# Patient Record
Sex: Female | Born: 1990 | Race: White | Hispanic: No | Marital: Married | Smoking: Never smoker
Health system: Southern US, Community
[De-identification: ages and names within clinical notes are randomized; demographics above are authoritative.]

## PROBLEM LIST (undated history)

## (undated) DIAGNOSIS — R87629 Unspecified abnormal cytological findings in specimens from vagina: Secondary | ICD-10-CM

## (undated) DIAGNOSIS — Z789 Other specified health status: Secondary | ICD-10-CM

## (undated) DIAGNOSIS — F419 Anxiety disorder, unspecified: Secondary | ICD-10-CM

## (undated) DIAGNOSIS — R000 Tachycardia, unspecified: Secondary | ICD-10-CM

## (undated) HISTORY — DX: Tachycardia, unspecified: R00.0

## (undated) HISTORY — PX: NO PAST SURGERIES: SHX2092

## (undated) HISTORY — PX: OTHER SURGICAL HISTORY: SHX169

## (undated) HISTORY — DX: Unspecified abnormal cytological findings in specimens from vagina: R87.629

## (undated) HISTORY — DX: Anxiety disorder, unspecified: F41.9

---
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
## 2013-05-26 ENCOUNTER — Emergency Department (HOSPITAL_BASED_OUTPATIENT_CLINIC_OR_DEPARTMENT_OTHER)
Admission: EM | Admit: 2013-05-26 | Discharge: 2013-05-27 | Disposition: A | Discharge status: Home / Self Care | Payer: BC Managed Care – PPO | Attending: Emergency Medicine | Admitting: Emergency Medicine

## 2013-05-26 ENCOUNTER — Encounter (HOSPITAL_BASED_OUTPATIENT_CLINIC_OR_DEPARTMENT_OTHER): Payer: Self-pay | Admitting: Emergency Medicine

## 2013-05-26 DIAGNOSIS — Z79899 Other long term (current) drug therapy: Secondary | ICD-10-CM | POA: Insufficient documentation

## 2013-05-26 DIAGNOSIS — N72 Inflammatory disease of cervix uteri: Secondary | ICD-10-CM

## 2013-05-26 DIAGNOSIS — Z3202 Encounter for pregnancy test, result negative: Secondary | ICD-10-CM | POA: Insufficient documentation

## 2013-05-26 LAB — URINALYSIS, ROUTINE W REFLEX MICROSCOPIC
Bilirubin Urine: NEGATIVE
Ketones, ur: NEGATIVE mg/dL
Nitrite: NEGATIVE
Protein, ur: NEGATIVE mg/dL
Urobilinogen, UA: 1 mg/dL (ref 0.0–1.0)
pH: 7.5 (ref 5.0–8.0)

## 2013-05-26 LAB — URINE MICROSCOPIC-ADD ON

## 2013-05-26 LAB — PREGNANCY, URINE: Preg Test, Ur: NEGATIVE

## 2013-05-26 NOTE — ED Notes (Signed)
Patient with vaginal burning, itching, and discharge for the past two weeks.

## 2013-05-27 LAB — WET PREP, GENITAL
Trich, Wet Prep: NONE SEEN
Yeast Wet Prep HPF POC: NONE SEEN

## 2013-05-27 MED ORDER — AZITHROMYCIN 250 MG PO TABS: ORAL_TABLET | ORAL | Status: DC

## 2013-05-27 MED ORDER — ONDANSETRON 4 MG PO TBDP
ORAL_TABLET | ORAL | Status: AC
  Filled 2013-05-27: qty 1

## 2013-05-27 MED ORDER — AZITHROMYCIN 250 MG PO TABS
ORAL_TABLET | ORAL | Status: AC
  Filled 2013-05-27: qty 1

## 2013-05-27 MED ORDER — ONDANSETRON 4 MG PO TBDP
4.0000 mg | ORAL_TABLET | Freq: Once | ORAL | Status: AC
  Administered 2013-05-27: 4 mg via ORAL

## 2013-05-27 MED ORDER — LIDOCAINE HCL (PF) 1 % IJ SOLN
INTRAMUSCULAR | Status: AC
  Administered 2013-05-27: 1 mL
  Filled 2013-05-27: qty 5

## 2013-05-27 MED ORDER — AZITHROMYCIN 250 MG PO TABS
1000.0000 mg | ORAL_TABLET | Freq: Once | ORAL | Status: AC
  Administered 2013-05-27: 1000 mg via ORAL
  Filled 2013-05-27: qty 4

## 2013-05-27 MED ORDER — CEFTRIAXONE SODIUM 250 MG IJ SOLR
250.0000 mg | Freq: Once | INTRAMUSCULAR | Status: AC
  Administered 2013-05-27: 250 mg via INTRAMUSCULAR
  Filled 2013-05-27: qty 250

## 2013-05-27 NOTE — ED Provider Notes (Signed)
CSN: [id number]     Arrival date & time 05/26/13  2210 History   First MD Initiated Contact with Patient 05/27/13 0009     Chief Complaint  Patient presents with  . Vaginal Itching   (Consider location/radiation/quality/duration/timing/severity/associated sxs/prior Treatment) HPI This is a 22 year old female with a two-week history of vaginal burning, vaginal itching and vaginal discharge. She is also having pelvic pain. The vaginal discharge has been clear but is turning green. The pain is worse with urination or movement. She denies fever or chills.  History reviewed. No pertinent past medical history. History reviewed. No pertinent past surgical history. History reviewed. No pertinent family history. History  Substance Use Topics  . Smoking status: Never Smoker   . Smokeless tobacco: Not on file  . Alcohol Use: No   OB History   Grav Para Term Preterm Abortions TAB SAB Ect Mult Living                 Review of Systems  All other systems reviewed and are negative.    Allergies  Review of patient's allergies indicates no known allergies.  Home Medications   Current Outpatient Rx  Name  Route  Sig  Dispense  Refill  . HYDROcodone-acetaminophen (VICODIN) 5-500 MG per tablet   Oral   Take 0.5 tablets by mouth every 6 (six) hours as needed. For pain          . norethindrone-ethinyl estradiol (MICROGESTIN,JUNEL,LOESTRIN) 1-20 MG-MCG tablet   Oral   Take 1 tablet by mouth daily.            BP 116/70  Pulse 80  Temp(Src) 98.1 F (36.7 C) (Oral)  Resp 16  Ht 5\' 2"  (1.575 m)  Wt 108 lb (48.988 kg)  BMI 19.75 kg/m2  SpO2 100%  Physical Exam General: Well-developed, well-nourished female in no acute distress; appearance consistent with age of record HENT: normocephalic; atraumatic Eyes: pupils equal, round and reactive to light; extraocular muscles intact Neck: supple Heart: regular rate and rhythm; no murmurs, rubs or gallops Lungs: clear to auscultation  bilaterally Abdomen: soft; nondistended; mild suprapubic tenderness; no masses or hepatosplenomegaly; bowel sounds present GU: No CVA tenderness; normal external genitalia; no vaginal bleeding; profuse purulent vaginal discharge; cervix erythematous; cervical motion tenderness; bilateral adnexal tenderness Extremities: No deformity; full range of motion; pulses normal Neurologic: Awake, alert and oriented; motor function intact in all extremities and symmetric; no facial droop Skin: Warm and dry Psychiatric: Anxious    ED Course  Procedures (including critical care time)    MDM   Nursing notes and vitals signs, including pulse oximetry, reviewed.  Summary of this visit's results, reviewed by myself:  Labs:  Results for orders placed during the hospital encounter of 05/26/13 (from the past 24 hour(s))  PREGNANCY, URINE     Status: None   Collection Time    05/26/13 10:20 PM      Result Value Range   Preg Test, Ur NEGATIVE  NEGATIVE  URINALYSIS, ROUTINE W REFLEX MICROSCOPIC     Status: Abnormal   Collection Time    05/26/13 10:20 PM      Result Value Range   Color, Urine YELLOW  YELLOW   APPearance CLEAR  CLEAR   Specific Gravity, Urine 1.014  1.005 - 1.030   pH 7.5  5.0 - 8.0   Glucose, UA NEGATIVE  NEGATIVE mg/dL   Hgb urine dipstick NEGATIVE  NEGATIVE   Bilirubin Urine NEGATIVE  NEGATIVE   Ketones, ur NEGATIVE  NEGATIVE mg/dL   Protein, ur NEGATIVE  NEGATIVE mg/dL   Urobilinogen, UA 1.0  0.0 - 1.0 mg/dL   Nitrite NEGATIVE  NEGATIVE   Leukocytes, UA TRACE (*) NEGATIVE  URINE MICROSCOPIC-ADD ON     Status: None   Collection Time    05/26/13 10:20 PM      Result Value Range   Squamous Epithelial / LPF RARE  RARE   WBC, UA 0-2  <3 WBC/hpf   RBC / HPF 0-2  <3 RBC/hpf   Bacteria, UA RARE  RARE  WET PREP, GENITAL     Status: Abnormal   Collection Time    05/27/13 12:19 AM      Result Value Range   Yeast Wet Prep HPF POC NONE SEEN  NONE SEEN   Trich, Wet Prep NONE  SEEN  NONE SEEN   Clue Cells Wet Prep HPF POC NONE SEEN  NONE SEEN   WBC, Wet Prep HPF POC MANY (*) NONE SEEN   12:59 AM The patient says that she has been monogamous for the past 3 years. We will treat presumptively for cervicitis/early PID. She was advised to avoid sexual intercourse pending results of GC/chlamydia test     Hanley Seamen, MD 05/27/13 [phone]

## 2013-05-27 NOTE — ED Notes (Addendum)
md at bedside with RN

## 2013-05-28 LAB — GC/CHLAMYDIA PROBE AMP: GC Probe RNA: NEGATIVE

## 2018-11-06 LAB — OB RESULTS CONSOLE ABO/RH: RH Type: POSITIVE

## 2018-11-06 LAB — OB RESULTS CONSOLE ANTIBODY SCREEN: Antibody Screen: NEGATIVE

## 2018-11-06 LAB — OB RESULTS CONSOLE GC/CHLAMYDIA
Chlamydia: NEGATIVE
Gonorrhea: NEGATIVE

## 2018-11-06 LAB — OB RESULTS CONSOLE RPR: RPR: NONREACTIVE

## 2018-11-06 LAB — OB RESULTS CONSOLE RUBELLA ANTIBODY, IGM: Rubella: IMMUNE

## 2018-11-06 LAB — OB RESULTS CONSOLE HIV ANTIBODY (ROUTINE TESTING): HIV: NONREACTIVE

## 2018-11-06 LAB — OB RESULTS CONSOLE HEPATITIS B SURFACE ANTIGEN: Hepatitis B Surface Ag: NEGATIVE

## 2019-04-17 ENCOUNTER — Observation Stay (HOSPITAL_COMMUNITY)
Admission: AD | Admit: 2019-04-17 | Discharge: 2019-04-18 | Disposition: A | Discharge status: Home / Self Care | Payer: 59 | Attending: Obstetrics and Gynecology | Admitting: Obstetrics and Gynecology

## 2019-04-17 ENCOUNTER — Encounter (HOSPITAL_COMMUNITY): Payer: Self-pay

## 2019-04-17 ENCOUNTER — Inpatient Hospital Stay (HOSPITAL_BASED_OUTPATIENT_CLINIC_OR_DEPARTMENT_OTHER): Payer: 59

## 2019-04-17 DIAGNOSIS — Z3A31 31 weeks gestation of pregnancy: Secondary | ICD-10-CM

## 2019-04-17 DIAGNOSIS — Z20828 Contact with and (suspected) exposure to other viral communicable diseases: Secondary | ICD-10-CM | POA: Diagnosis not present

## 2019-04-17 DIAGNOSIS — O468X3 Other antepartum hemorrhage, third trimester: Secondary | ICD-10-CM | POA: Diagnosis present

## 2019-04-17 DIAGNOSIS — O4693 Antepartum hemorrhage, unspecified, third trimester: Secondary | ICD-10-CM | POA: Diagnosis not present

## 2019-04-17 DIAGNOSIS — O4703 False labor before 37 completed weeks of gestation, third trimester: Secondary | ICD-10-CM

## 2019-04-17 HISTORY — DX: Other specified health status: Z78.9

## 2019-04-17 LAB — URINALYSIS, ROUTINE W REFLEX MICROSCOPIC
Bilirubin Urine: NEGATIVE
Glucose, UA: NEGATIVE mg/dL
Ketones, ur: NEGATIVE mg/dL
Leukocytes,Ua: NEGATIVE
Nitrite: NEGATIVE
Protein, ur: NEGATIVE mg/dL
Specific Gravity, Urine: 1.011 (ref 1.005–1.030)
pH: 7 (ref 5.0–8.0)

## 2019-04-17 LAB — TYPE AND SCREEN
ABO/RH(D): O POS
Antibody Screen: NEGATIVE

## 2019-04-17 LAB — ABO/RH: ABO/RH(D): O POS

## 2019-04-17 LAB — OB RESULTS CONSOLE GBS: GBS: POSITIVE

## 2019-04-17 LAB — SARS CORONAVIRUS 2 (TAT 6-24 HRS): SARS Coronavirus 2: NEGATIVE

## 2019-04-17 MED ORDER — PRENATAL MULTIVITAMIN CH: 1.0000 | ORAL_TABLET | Freq: Every day | ORAL | Status: DC

## 2019-04-17 MED ORDER — DOCUSATE SODIUM 100 MG PO CAPS: 100.0000 mg | ORAL_CAPSULE | Freq: Every day | ORAL | Status: DC

## 2019-04-17 MED ORDER — ACETAMINOPHEN 325 MG PO TABS: 650.0000 mg | ORAL_TABLET | ORAL | Status: DC | PRN

## 2019-04-17 MED ORDER — ZOLPIDEM TARTRATE 5 MG PO TABS: 5.0000 mg | ORAL_TABLET | Freq: Every evening | ORAL | Status: DC | PRN

## 2019-04-17 MED ORDER — CALCIUM CARBONATE ANTACID 500 MG PO CHEW: 2.0000 | CHEWABLE_TABLET | ORAL | Status: DC | PRN

## 2019-04-17 NOTE — Progress Notes (Signed)
Patient would like to shower.  For the past two hours has not been feeling contractions. No further bleeding.  I gave RN order to allow patient to shower.  CEFM after shower.

## 2019-04-17 NOTE — MAU Provider Note (Signed)
History     CSN: [id number]  Arrival date and time: 04/17/19 7846   First Provider Initiated Contact with Patient 04/17/19 0230      Chief Complaint  Patient presents with  . Vaginal Bleeding   HPI  Ms.  Margaret Lane is a 28 y.o. year old G60P0 female at [redacted]w[redacted]d weeks gestation who presents to MAU reporting vaginal bleeding after SI tonight. She reports the bleeding is more than spotting, but not as heavy as a period. She has not passed any blood clots. She also reports having some irregular, lower abdominal cramping and tightening once the bleeding started. She reports (+) FM. She denies any problems in her pregnancy until now.  *Spouse present at bedside and offering information for history taking.   Past Medical History:  Diagnosis Date  . Medical history non-contributory     Past Surgical History:  Procedure Laterality Date  . NO PAST SURGERIES      History reviewed. No pertinent family history.  Social History   Tobacco Use  . Smoking status: Not on file  . Smokeless tobacco: Never Used  Substance Use Topics  . Alcohol use: Never    Frequency: Never  . Drug use: Never    Allergies:  Allergies  Allergen Reactions  . Flonase [Fluticasone Propionate] Anaphylaxis    "itching in throat and felt like throat was closing making it difficult to breathe."    Medications Prior to Admission  Medication Sig Dispense Refill Last Dose  . doxylamine, Sleep, (UNISOM) 25 MG tablet Take 25 mg by mouth at bedtime as needed.   Past Week at Unknown time  . Prenatal Vit-Fe Fumarate-FA (MULTIVITAMIN-PRENATAL) 27-0.8 MG TABS tablet Take 1 tablet by mouth daily at 12 noon.   04/16/2019 at Unknown time  . Pyridoxine HCl (B-6 PO) Take by mouth.   04/16/2019 at Unknown time    Review of Systems  Constitutional: Negative.   HENT: Negative.   Eyes: Negative.   Respiratory: Negative.   Cardiovascular: Negative.   Gastrointestinal: Negative.   Endocrine: Negative.   Genitourinary:  Positive for pelvic pain (cramping) and vaginal bleeding (heavy spotting).  Musculoskeletal: Negative.   Skin: Negative.   Allergic/Immunologic: Negative.   Neurological: Negative.   Hematological: Negative.   Psychiatric/Behavioral: Negative.    Physical Exam   Blood pressure 128/67, pulse 97, temperature 98.2 F (36.8 C), temperature source Oral, resp. rate 19, weight 62.9 kg.  Physical Exam  Nursing note and vitals reviewed. Constitutional: She is oriented to person, place, and time. She appears well-developed and well-nourished.  HENT:  Head: Normocephalic and atraumatic.  Eyes: Pupils are equal, round, and reactive to light.  Neck: Normal range of motion.  Cardiovascular: Normal rate.  Respiratory: Effort normal.  GI: Soft.  Genitourinary:    Genitourinary Comments: Moderate amount of dark, red blood in vaginal vault with what appears to be uterine lining vs placental pieces -- certain not blood clots -- removed with about 4 large cotton swabs  Cervix: closed/thick/posterior  **patient very anxious throughout exam, but tolerated procedure well   Musculoskeletal: Normal range of motion.  Neurological: She is alert and oriented to person, place, and time.  Skin: Skin is warm and dry.  Psychiatric: She has a normal mood and affect. Her behavior is normal. Judgment and thought content normal.   NST - FHR: 140 bpm / moderate variability / accels present / decels absent / TOCO: regular every 5-9 mins  Reassessment of NST @ 0100: FHR: 140 bpm /  moderate variability / accels present / decels absent / TOCO: regular every 3-4 mins -- @ 0600 patient feeling them "stronger now, more painful" rated 6/10  Cervical reassessment @ 0600: Dilation: Closed Effacement (%): Thick Cervical Position: Posterior Station: Ballotable Presentation: Vertex Exam by:: Carloyn Jaeger, CNM  MAU Course  Procedures  MDM CCUA EFM OB Limited MFM U/S  *Consult with Dr. Emelda Fear @ 0102 - notified of  patient's complaints, assessments, lab & U/S results, tx plan observe x 4 hours, re-evaluate cervix and NST  update @ [phone]: UC's more frequent, stronger to patient, VB scant, cervix unchanged -- orders received to call Dr. Elon Spanner and recommend admission to [facility] with MgSO4 for neuro prophylaxis & BMZ  *Consult with Dr. Elon Spanner @ [phone] - notified of Dr. Rayna Sexton recommended tx plan - agrees with plan; orders received to proceed with MgSO4 orders, BMZ CEFM, GBS, and clear diet until seen by Dr. Elon Spanner   Results for orders placed or performed during the hospital encounter of 04/17/19 (from the past 24 hour(s))  Urinalysis, Routine w reflex microscopic     Status: Abnormal   Collection Time: 04/17/19  1:52 AM  Result Value Ref Range   Color, Urine YELLOW YELLOW   APPearance CLEAR CLEAR   Specific Gravity, Urine 1.011 1.005 - 1.030   pH 7.0 5.0 - 8.0   Glucose, UA NEGATIVE NEGATIVE mg/dL   Hgb urine dipstick SMALL (A) NEGATIVE   Bilirubin Urine NEGATIVE NEGATIVE   Ketones, ur NEGATIVE NEGATIVE mg/dL   Protein, ur NEGATIVE NEGATIVE mg/dL   Nitrite NEGATIVE NEGATIVE   Leukocytes,Ua NEGATIVE NEGATIVE   RBC / HPF 0-5 0 - 5 RBC/hpf   WBC, UA 0-5 0 - 5 WBC/hpf   Bacteria, UA RARE (A) NONE SEEN   Squamous Epithelial / LPF 0-5 0 - 5   Mucus PRESENT      Assessment and Plan  Preterm uterine contractions in third trimester, antepartum Vaginal bleeding in pregnancy, third trimester  - Admit to OBSCU for 23 hour observation - Place IV - Magnesium Sulfate - GBS - BMZ 12 mg IM x 2 doses -- d/c order @ 0715 d/t anaphylactic rxn to nasal steroid - CEFM - Clear diet -- orders changes @ 0720 - Discussed plan of care with patient and spouse  - Patient verbalized an understanding of the plan of care and agrees.  - Dr. Elon Spanner assumes care of patient at 0710  Margaret Mora, MSN, CNM 04/17/2019, 2:30 AM

## 2019-04-17 NOTE — MAU Note (Signed)
Patient reports post coital bleeding that started tonight, states the amount is more than spotting, more like the amount of a period.  No clots.  No complications with her pregnancy.  + FM.  Also feeling some cramping and abdominal tightening that occurs irregularly.

## 2019-04-17 NOTE — Progress Notes (Signed)
Discussed patient with Dr. Royston Sinner. Pt will not be started on IV magnesium as long as status does not change.

## 2019-04-17 NOTE — H&P (Addendum)
Larina Sandoval is a 28 y.o. female presenting for vaginal bleeding. Started after intercourse Was seen and evaluate by MAU providers. Upon arrival, bleeding was moderate. Provider extracted old blood, clot, and tissue from vagina with a closed cervix. She was monitored for four hours and while the bleeding has slowed and almost resolved, she continues to have contractions on toco. An [place] was performed that showed no abruption. This pregnancy has been uncomplicated. She is a Materials engineer and in school.   OB History    Gravida  1   Para      Term      Preterm      AB      Living        SAB      TAB      Ectopic      Multiple      Live Births             Past Medical History:  Diagnosis Date  . Medical history non-contributory    Past Surgical History:  Procedure Laterality Date  . NO PAST SURGERIES     Family History: family history is not on file. Social History:  does not have a smoking history on file. She has never used smokeless tobacco. She reports that she does not drink alcohol or use drugs.     Maternal Diabetes: No Genetic Screening: Normal Maternal Ultrasounds/Referrals: Normal Fetal Ultrasounds or other Referrals:  None Maternal Substance Abuse:  No Significant Maternal Medications:  None Significant Maternal Lab Results:  None Other Comments:  None  ROS History Dilation: Closed Effacement (%): Thick Station: Ballotable Exam by:: Sunday Corn, CNM Blood pressure 128/67, pulse 97, temperature 98.2 F (36.8 C), temperature source Oral, resp. rate 19, weight 62.9 kg. Exam Physical Exam  NAD, A&O NWOB Abd soft, nondistended, gravid No bleeding on underwear  Prenatal labs: ABO, Rh:  O+ Antibody:  Negative Rubella:   RPR:    HBsAg:    HIV:    GBS:   unknown  Assessment/Plan:  28 yo G1P0 @ 31.0 wga with vaginal bleeding, now improving but with regular contractions c/f preterm labor.  # Vaginal bleeding: unknown etiology. Membranes intact -  slowing and almost resolved which is reassuring - [place] w/out abruption - may be s/s PTL vs cervical friability  # PTL: no cervical change but toco with regular contractions - Magnesium for tocolysis prn cervical change  # FWB:  - defer BMZ given anaphylactic reaction to nasal steroids - NICU consult prn - Mag for CP proph prn cervical change - expecting a girl  # ROD: vertex by [place] - if labors, re confirm position given preterm  # GBS: unknown, swabbed in MAU. PCN prn cervical change.  # Dispo: obs     Tyson Dense 04/17/2019, 8:02 AM

## 2019-04-17 NOTE — Progress Notes (Signed)
Called Dr.Leger Pt c/o no cramping or ctx's, or vaginal bleeding.  +FM as per Pt.  As per Dr.Leger "Pt may come off monitor at this time".  New orders recv'd.

## 2019-04-18 NOTE — Discharge Summary (Signed)
Physician Discharge Summary  Patient ID: Margaret Lane MRN: [id number] DOB/AGE: [birth year] 26 y.o.  Admit date: 04/17/2019 Discharge date: 04/18/2019  Admission Diagnoses:  Discharge Diagnoses:  Active Problems:   Preterm uterine contractions in third trimester, antepartum   Discharged Condition: good  Hospital Course: 28 yo G1P0 admitted at 31.0 wga with vaginal bleeding of unknown etiology and contractions. Bleeding was moderate on arrival but slowly improved and on HD#1 resolved. [place] was done and showed no abruption. Cervix remained closed and long. Contractions were regular x 6-8 hours and then resolved. Baby was always reactive. On HD#2 she had been without any vaginal bleeding or contractions in >20 hours and was deemed stable to discharge.  Of note, BM was deferred s/s possible intranasal steroid anaphylactic allergy.   Consults: None  Significant Diagnostic Studies: None  Treatments: IV hydration  Discharge Exam: Blood pressure 114/62, pulse (!) 104, temperature 98.2 F (36.8 C), temperature source Oral, resp. rate 18, weight 62.9 kg, SpO2 99 %. General appearance: alert, cooperative and appears stated age Resp: clear to auscultation bilaterally Cardio: regular rate and rhythm GI: soft, non-tender; bowel sounds normal; no masses,  no organomegaly and gravid Extremities: extremities normal, atraumatic, no cyanosis or edema and Homans sign is negative, no sign of DVT Skin: Skin color, texture, turgor normal. No rashes or lesions no vaginal bleeding  Disposition: Discharge disposition: 01-Home or Self Care       Discharge Instructions    Discharge activity:   Complete by: As directed    Discharge diet:  No restrictions   Complete by: As directed    Discharge instructions   Complete by: As directed    Pelvic rest   Do not have sex or do anything that might make you have an orgasm   Complete by: As directed    Fetal Kick Count:  Lie on our left side for one hour  after a meal, and count the number of times your baby kicks.  If it is less than 5 times, get up, move around and drink some juice.  Repeat the test 30 minutes later.  If it is still less than 5 kicks in an hour, notify your doctor.   Complete by: As directed    Notify physician for a general feeling that "something is not right"   Complete by: As directed    Notify physician for increase or change in vaginal discharge   Complete by: As directed    Notify physician for intestinal cramps, with or without diarrhea, sometimes described as "gas pain"   Complete by: As directed    Notify physician for leaking of fluid   Complete by: As directed    Notify physician for low, dull backache, unrelieved by heat or Tylenol   Complete by: As directed    Notify physician for menstrual like cramps   Complete by: As directed    Notify physician for pelvic pressure   Complete by: As directed    Notify physician for uterine contractions.  These may be painless and feel like the uterus is tightening or the baby is  "balling up"   Complete by: As directed    Notify physician for vaginal bleeding   Complete by: As directed    PRETERM LABOR:  Includes any of the follwing symptoms that occur between 20 - [redacted] weeks gestation.  If these symptoms are not stopped, preterm labor can result in preterm delivery, placing your baby at risk   Complete by: As directed  Allergies as of 04/18/2019      Reactions   Flonase [fluticasone Propionate] Anaphylaxis   "itching in throat and felt like throat was closing making it difficult to breathe."      Medication List    TAKE these medications   B-6 PO Take by mouth.   doxylamine (Sleep) 25 MG tablet Commonly known as: UNISOM Take 25 mg by mouth at bedtime as needed.   multivitamin-prenatal 27-0.8 MG Tabs tablet Take 1 tablet by mouth daily at 12 noon.        Signed: Tyson Dense 04/18/2019, 8:29 AM

## 2019-04-19 ENCOUNTER — Encounter (HOSPITAL_COMMUNITY): Payer: Self-pay

## 2019-04-19 ENCOUNTER — Encounter (HOSPITAL_BASED_OUTPATIENT_CLINIC_OR_DEPARTMENT_OTHER): Payer: Self-pay | Admitting: Emergency Medicine

## 2019-04-19 LAB — CULTURE, BETA STREP (GROUP B ONLY)

## 2019-06-04 NOTE — L&D Delivery Note (Signed)
Vtx at +3 station and LOA.  Pt desires VE due to repetative deep variables..  I discussed the R&B of VE including but not limited to injury to fetus but the potential benefit of expedited delivery.  She gives her informed consent and wishes to proceed. On the 3rd pull delivered viable female apgars 8,9 over 1st degree lac.  NICU in attendance   Placenta delivered spontaneously intact with 3VC. Repair with 2-0 Chromic with good support and hemostasis noted and R/V exam confirms.  PH art was sent and was 7.07..  Mother and baby were doing well.  EBL 100cc  Candice Camp, MD

## 2019-06-07 ENCOUNTER — Telehealth (HOSPITAL_COMMUNITY): Payer: Self-pay | Admitting: *Deleted

## 2019-06-07 ENCOUNTER — Encounter (HOSPITAL_COMMUNITY): Payer: Self-pay | Admitting: *Deleted

## 2019-06-07 NOTE — Telephone Encounter (Signed)
Preadmission screen  

## 2019-06-14 ENCOUNTER — Other Ambulatory Visit (HOSPITAL_COMMUNITY)
Admission: RE | Admit: 2019-06-14 | Discharge: 2019-06-14 | Disposition: A | Discharge status: Home / Self Care | Payer: BC Managed Care – PPO | Source: Ambulatory Visit | Attending: Obstetrics and Gynecology | Admitting: Obstetrics and Gynecology

## 2019-06-14 DIAGNOSIS — Z20822 Contact with and (suspected) exposure to covid-19: Secondary | ICD-10-CM | POA: Diagnosis not present

## 2019-06-14 DIAGNOSIS — Z01812 Encounter for preprocedural laboratory examination: Secondary | ICD-10-CM | POA: Diagnosis present

## 2019-06-14 LAB — SARS CORONAVIRUS 2 (TAT 6-24 HRS): SARS Coronavirus 2: NEGATIVE

## 2019-06-16 ENCOUNTER — Inpatient Hospital Stay (HOSPITAL_COMMUNITY)
Admission: AD | Admit: 2019-06-16 | Discharge: 2019-06-18 | DRG: 807 | Disposition: A | Discharge status: Home / Self Care | Payer: BC Managed Care – PPO | Attending: Obstetrics and Gynecology | Admitting: Obstetrics and Gynecology

## 2019-06-16 ENCOUNTER — Inpatient Hospital Stay (HOSPITAL_COMMUNITY): Payer: BC Managed Care – PPO | Admitting: Anesthesiology

## 2019-06-16 ENCOUNTER — Encounter (HOSPITAL_COMMUNITY): Payer: Self-pay | Admitting: Obstetrics and Gynecology

## 2019-06-16 ENCOUNTER — Inpatient Hospital Stay (HOSPITAL_COMMUNITY): Payer: BC Managed Care – PPO

## 2019-06-16 ENCOUNTER — Other Ambulatory Visit: Payer: Self-pay

## 2019-06-16 DIAGNOSIS — D649 Anemia, unspecified: Secondary | ICD-10-CM | POA: Diagnosis present

## 2019-06-16 DIAGNOSIS — O26893 Other specified pregnancy related conditions, third trimester: Secondary | ICD-10-CM | POA: Diagnosis present

## 2019-06-16 DIAGNOSIS — Z3A39 39 weeks gestation of pregnancy: Secondary | ICD-10-CM | POA: Diagnosis not present

## 2019-06-16 DIAGNOSIS — Z3490 Encounter for supervision of normal pregnancy, unspecified, unspecified trimester: Secondary | ICD-10-CM | POA: Diagnosis present

## 2019-06-16 DIAGNOSIS — O99824 Streptococcus B carrier state complicating childbirth: Principal | ICD-10-CM | POA: Diagnosis present

## 2019-06-16 DIAGNOSIS — O9902 Anemia complicating childbirth: Secondary | ICD-10-CM | POA: Diagnosis present

## 2019-06-16 LAB — TYPE AND SCREEN
ABO/RH(D): O POS
Antibody Screen: NEGATIVE

## 2019-06-16 LAB — CBC
HCT: 34.5 % — ABNORMAL LOW (ref 36.0–46.0)
Hemoglobin: 10.6 g/dL — ABNORMAL LOW (ref 12.0–15.0)
MCH: 25.5 pg — ABNORMAL LOW (ref 26.0–34.0)
MCHC: 30.7 g/dL (ref 30.0–36.0)
MCV: 82.9 fL (ref 80.0–100.0)
Platelets: 211 10*3/uL (ref 150–400)
RBC: 4.16 MIL/uL (ref 3.87–5.11)
RDW: 13.4 % (ref 11.5–15.5)
WBC: 12.1 10*3/uL — ABNORMAL HIGH (ref 4.0–10.5)
nRBC: 0 % (ref 0.0–0.2)

## 2019-06-16 LAB — ABO/RH: ABO/RH(D): O POS

## 2019-06-16 LAB — RPR: RPR Ser Ql: NONREACTIVE

## 2019-06-16 MED ORDER — OXYCODONE-ACETAMINOPHEN 5-325 MG PO TABS: 2.0000 | ORAL_TABLET | ORAL | Status: DC | PRN

## 2019-06-16 MED ORDER — ZOLPIDEM TARTRATE 5 MG PO TABS: 5.0000 mg | ORAL_TABLET | Freq: Every evening | ORAL | Status: DC | PRN

## 2019-06-16 MED ORDER — OXYCODONE-ACETAMINOPHEN 5-325 MG PO TABS: 1.0000 | ORAL_TABLET | ORAL | Status: DC | PRN

## 2019-06-16 MED ORDER — DIPHENHYDRAMINE HCL 25 MG PO CAPS: 25.0000 mg | ORAL_CAPSULE | Freq: Four times a day (QID) | ORAL | Status: DC | PRN

## 2019-06-16 MED ORDER — SENNOSIDES-DOCUSATE SODIUM 8.6-50 MG PO TABS
2.0000 | ORAL_TABLET | ORAL | Status: DC
  Administered 2019-06-17 (×2): 2 via ORAL
  Filled 2019-06-16 (×2): qty 2

## 2019-06-16 MED ORDER — LACTATED RINGERS IV SOLN
500.0000 mL | INTRAVENOUS | Status: DC | PRN
  Administered 2019-06-16: 500 mL via INTRAVENOUS

## 2019-06-16 MED ORDER — COCONUT OIL OIL: 1.0000 "application " | TOPICAL_OIL | Status: DC | PRN

## 2019-06-16 MED ORDER — BUTORPHANOL TARTRATE 1 MG/ML IJ SOLN
1.0000 mg | INTRAMUSCULAR | Status: DC | PRN
  Administered 2019-06-16: 1 mg via INTRAVENOUS
  Filled 2019-06-16: qty 1

## 2019-06-16 MED ORDER — SODIUM CHLORIDE 0.9 % IV SOLN
5.0000 10*6.[IU] | Freq: Once | INTRAVENOUS | Status: AC
  Administered 2019-06-16: 5 10*6.[IU] via INTRAVENOUS
  Filled 2019-06-16: qty 5

## 2019-06-16 MED ORDER — SOD CITRATE-CITRIC ACID 500-334 MG/5ML PO SOLN: 30.0000 mL | ORAL | Status: DC | PRN

## 2019-06-16 MED ORDER — MISOPROSTOL 25 MCG QUARTER TABLET
25.0000 ug | ORAL_TABLET | ORAL | Status: DC | PRN
  Administered 2019-06-16 (×2): 25 ug via VAGINAL
  Filled 2019-06-16 (×2): qty 1

## 2019-06-16 MED ORDER — TETANUS-DIPHTH-ACELL PERTUSSIS 5-2.5-18.5 LF-MCG/0.5 IM SUSP: 0.5000 mL | Freq: Once | INTRAMUSCULAR | Status: DC

## 2019-06-16 MED ORDER — SODIUM CHLORIDE (PF) 0.9 % IJ SOLN
INTRAMUSCULAR | Status: DC | PRN
  Administered 2019-06-16: 12 mL/h via EPIDURAL

## 2019-06-16 MED ORDER — ONDANSETRON HCL 4 MG PO TABS: 4.0000 mg | ORAL_TABLET | ORAL | Status: DC | PRN

## 2019-06-16 MED ORDER — PENICILLIN G POT IN DEXTROSE 60000 UNIT/ML IV SOLN
3.0000 10*6.[IU] | INTRAVENOUS | Status: DC
  Administered 2019-06-16: 3 10*6.[IU] via INTRAVENOUS
  Filled 2019-06-16: qty 50

## 2019-06-16 MED ORDER — WITCH HAZEL-GLYCERIN EX PADS: 1.0000 "application " | MEDICATED_PAD | CUTANEOUS | Status: DC | PRN

## 2019-06-16 MED ORDER — OXYTOCIN 40 UNITS IN NORMAL SALINE INFUSION - SIMPLE MED
2.5000 [IU]/h | INTRAVENOUS | Status: DC
  Filled 2019-06-16: qty 1000

## 2019-06-16 MED ORDER — LACTATED RINGERS IV SOLN: INTRAVENOUS | Status: DC

## 2019-06-16 MED ORDER — TERBUTALINE SULFATE 1 MG/ML IJ SOLN: 0.2500 mg | Freq: Once | INTRAMUSCULAR | Status: DC | PRN

## 2019-06-16 MED ORDER — ACETAMINOPHEN 325 MG PO TABS: 650.0000 mg | ORAL_TABLET | ORAL | Status: DC | PRN

## 2019-06-16 MED ORDER — LACTATED RINGERS IV SOLN: 500.0000 mL | Freq: Once | INTRAVENOUS | Status: DC

## 2019-06-16 MED ORDER — SIMETHICONE 80 MG PO CHEW
80.0000 mg | CHEWABLE_TABLET | ORAL | Status: DC | PRN
  Administered 2019-06-17: 80 mg via ORAL
  Filled 2019-06-16: qty 1

## 2019-06-16 MED ORDER — PHENYLEPHRINE 40 MCG/ML (10ML) SYRINGE FOR IV PUSH (FOR BLOOD PRESSURE SUPPORT): 80.0000 ug | PREFILLED_SYRINGE | INTRAVENOUS | Status: DC | PRN

## 2019-06-16 MED ORDER — ACETAMINOPHEN 325 MG PO TABS
650.0000 mg | ORAL_TABLET | ORAL | Status: DC | PRN
  Administered 2019-06-17: 650 mg via ORAL
  Filled 2019-06-16: qty 2

## 2019-06-16 MED ORDER — FENTANYL-BUPIVACAINE-NACL 0.5-0.125-0.9 MG/250ML-% EP SOLN
EPIDURAL | Status: AC
  Filled 2019-06-16: qty 250

## 2019-06-16 MED ORDER — LIDOCAINE HCL (PF) 1 % IJ SOLN
30.0000 mL | INTRAMUSCULAR | Status: AC | PRN
  Administered 2019-06-16 (×2): 5 mL via SUBCUTANEOUS

## 2019-06-16 MED ORDER — IBUPROFEN 600 MG PO TABS
600.0000 mg | ORAL_TABLET | Freq: Four times a day (QID) | ORAL | Status: DC
  Administered 2019-06-16 – 2019-06-18 (×7): 600 mg via ORAL
  Filled 2019-06-16 (×8): qty 1

## 2019-06-16 MED ORDER — EPHEDRINE 5 MG/ML INJ: 10.0000 mg | INTRAVENOUS | Status: DC | PRN

## 2019-06-16 MED ORDER — MEDROXYPROGESTERONE ACETATE 150 MG/ML IM SUSP: 150.0000 mg | INTRAMUSCULAR | Status: DC | PRN

## 2019-06-16 MED ORDER — OXYTOCIN BOLUS FROM INFUSION: 500.0000 mL | Freq: Once | INTRAVENOUS | Status: DC

## 2019-06-16 MED ORDER — DIPHENHYDRAMINE HCL 50 MG/ML IJ SOLN: 12.5000 mg | INTRAMUSCULAR | Status: DC | PRN

## 2019-06-16 MED ORDER — ONDANSETRON HCL 4 MG/2ML IJ SOLN: 4.0000 mg | Freq: Four times a day (QID) | INTRAMUSCULAR | Status: DC | PRN

## 2019-06-16 MED ORDER — DIBUCAINE (PERIANAL) 1 % EX OINT: 1.0000 "application " | TOPICAL_OINTMENT | CUTANEOUS | Status: DC | PRN

## 2019-06-16 MED ORDER — PRENATAL MULTIVITAMIN CH
1.0000 | ORAL_TABLET | Freq: Every day | ORAL | Status: DC
  Filled 2019-06-16: qty 1

## 2019-06-16 MED ORDER — FENTANYL-BUPIVACAINE-NACL 0.5-0.125-0.9 MG/250ML-% EP SOLN: 12.0000 mL/h | EPIDURAL | Status: DC | PRN

## 2019-06-16 MED ORDER — BENZOCAINE-MENTHOL 20-0.5 % EX AERO
1.0000 "application " | INHALATION_SPRAY | CUTANEOUS | Status: DC | PRN
  Administered 2019-06-16: 1 via TOPICAL
  Filled 2019-06-16 (×2): qty 56

## 2019-06-16 MED ORDER — MEASLES, MUMPS & RUBELLA VAC IJ SOLR: 0.5000 mL | Freq: Once | INTRAMUSCULAR | Status: DC

## 2019-06-16 MED ORDER — ONDANSETRON HCL 4 MG/2ML IJ SOLN: 4.0000 mg | INTRAMUSCULAR | Status: DC | PRN

## 2019-06-16 NOTE — Anesthesia Procedure Notes (Signed)
Epidural Patient location during procedure: OB Start time: 06/16/2019 9:27 AM End time: 06/16/2019 9:37 AM  Staffing Anesthesiologist: Leonides Grills, MD Performed: anesthesiologist   Preanesthetic Checklist Completed: patient identified, IV checked, site marked, risks and benefits discussed, monitors and equipment checked, pre-op evaluation and timeout performed  Epidural Patient position: sitting Prep: DuraPrep Patient monitoring: heart rate, cardiac monitor, continuous pulse ox and blood pressure Approach: midline Location: L4-L5 Injection technique: LOR air  Needle:  Needle type: Tuohy  Needle gauge: 17 G Needle length: 9 cm Needle insertion depth: 6 cm Catheter type: closed end flexible Catheter size: 19 Gauge Catheter at skin depth: 11 cm Test dose: negative and Other  Assessment Events: blood not aspirated, injection not painful, no injection resistance and negative IV test  Additional Notes Informed consent obtained prior to proceeding including risk of failure, 1% risk of PDPH, risk of minor discomfort and bruising. Discussed alternatives to epidural analgesia and patient desires to proceed.  Timeout performed pre-procedure verifying patient name, procedure, and platelet count.  Patient tolerated procedure well. Reason for block:procedure for pain

## 2019-06-16 NOTE — H&P (Signed)
Margaret Lane is a 29 y.o. female presenting for IOL for elective reasons.  Pregnancy uncomplicated.  GBS+. OB History    Gravida  1   Para  0   Term  0   Preterm  0   AB  0   Living        SAB  0   TAB  0   Ectopic  0   Multiple      Live Births             Past Medical History:  Diagnosis Date  . Anxiety    situational  . Medical history non-contributory   . Vaginal Pap smear, abnormal    Past Surgical History:  Procedure Laterality Date  . dilation of urethra     as a child  . NO PAST SURGERIES     Family History: family history includes Diabetes in her father. Social History:  reports that she has never smoked. She has never used smokeless tobacco. She reports that she does not drink alcohol or use drugs.     Maternal Diabetes: No Genetic Screening: Normal Maternal Ultrasounds/Referrals: Normal Fetal Ultrasounds or other Referrals:  None Maternal Substance Abuse:  No Significant Maternal Medications:  None Significant Maternal Lab Results:  Group B Strep positive Other Comments:  None  Review of Systems History Dilation: 2 Effacement (%): 80 Station: -2 Exam by:: Lizzie Cokley Blood pressure (!) 112/57, pulse 90, temperature 98.9 F (37.2 C), temperature source Oral, resp. rate 16, height 5\' 2"  (1.575 m), weight 62.9 kg. Exam Physical Exam  Prenatal labs: ABO, Rh: --/--/O POS, O POS Performed at [facility], [address] [place], [place]  [phone] 0033) Antibody: NEG (01/13 0033) Rubella: Immune (06/05 0000) RPR: Nonreactive (06/05 0000)  HBsAg: Negative (06/05 0000)  HIV: Non-reactive (06/05 0000)  GBS: Positive/-- (11/14 0000)   Assessment/Plan: IUP at term Cytotec now AROM/Pit Anticipate SVD Abx for GBS   08-09-2002 06/16/2019, 9:25 AM

## 2019-06-16 NOTE — Anesthesia Preprocedure Evaluation (Signed)
Anesthesia Evaluation  Patient identified by MRN, date of birth, ID band Patient awake    Reviewed: Allergy & Precautions, H&P , NPO status , Patient's Chart, lab work & pertinent test results  History of Anesthesia Complications Negative for: history of anesthetic complications  Airway Mallampati: II  TM Distance: >3 FB Neck ROM: full    Dental no notable dental hx. (+) Teeth Intact   Pulmonary neg pulmonary ROS,    Pulmonary exam normal breath sounds clear to auscultation       Cardiovascular negative cardio ROS Normal cardiovascular exam Rhythm:regular Rate:Normal     Neuro/Psych Anxiety negative neurological ROS     GI/Hepatic negative GI ROS, Neg liver ROS,   Endo/Other  negative endocrine ROS  Renal/GU negative Renal ROS  negative genitourinary   Musculoskeletal   Abdominal   Peds  Hematology  (+) Blood dyscrasia, anemia ,   Anesthesia Other Findings   Reproductive/Obstetrics (+) Pregnancy                             Anesthesia Physical Anesthesia Plan  ASA: II  Anesthesia Plan: Epidural   Post-op Pain Management:    Induction:   PONV Risk Score and Plan:   Airway Management Planned:   Additional Equipment:   Intra-op Plan:   Post-operative Plan:   Informed Consent: I have reviewed the patients History and Physical, chart, labs and discussed the procedure including the risks, benefits and alternatives for the proposed anesthesia with the patient or authorized representative who has indicated his/her understanding and acceptance.       Plan Discussed with:   Anesthesia Plan Comments:         Anesthesia Quick Evaluation

## 2019-06-17 LAB — CBC
HCT: 31.1 % — ABNORMAL LOW (ref 36.0–46.0)
Hemoglobin: 9.6 g/dL — ABNORMAL LOW (ref 12.0–15.0)
MCH: 25.3 pg — ABNORMAL LOW (ref 26.0–34.0)
MCHC: 30.9 g/dL (ref 30.0–36.0)
MCV: 81.8 fL (ref 80.0–100.0)
Platelets: 182 10*3/uL (ref 150–400)
RBC: 3.8 MIL/uL — ABNORMAL LOW (ref 3.87–5.11)
RDW: 13.8 % (ref 11.5–15.5)
WBC: 11.7 10*3/uL — ABNORMAL HIGH (ref 4.0–10.5)
nRBC: 0 % (ref 0.0–0.2)

## 2019-06-17 NOTE — Lactation Note (Signed)
This note was copied from a baby's chart. Lactation Consultation Note Baby 15 hrs old. RN gave mom #20 NS fit well. Stimulated baby to wake up for due feeding. Taught mom application of NS. Discussed positioning, support, and props for feedings. Assisted in football hold. Taught "C" hold for latching. Baby latched, needed some stimulation to start feeding then fed well w/some occasional stimulation to cheek.  Mom's breast are filling, flat red nipples. Lt. Nipple w/bruise. Skin intact. Noted colostrum in NS. Mom encouraged to feed baby 8-12 times/24 hours and with feeding cues. Mom encouraged to waken baby for feeds if hasn't cued in 3 hrs. Mom shown how to use DEBP & how to disassemble, clean, & reassemble parts. Mom knows to pump q3h for 15-20 min. Noted mom has colostrum collecting at the tip of nipple while pumping. Praised mom. Mom excited.  Milk storage, breast massage, newborn behavior, STS, I&O, supply and demand discussed. Lactation brochure given. Suggested making f/u appt. Before d/c home d/t wearing NS.  Patient Name: Margaret Lane [id number] Date: 06/17/2019 Reason for consult: Initial assessment;Primapara;Term   Maternal Data Has patient been taught Hand Expression?: Yes Does the patient have breastfeeding experience prior to this delivery?: No  Feeding Feeding Type: Breast Fed  LATCH Score Latch: Grasps breast easily, tongue down, lips flanged, rhythmical sucking.  Audible Swallowing: A few with stimulation  Type of Nipple: Flat  Comfort (Breast/Nipple): Filling, red/small blisters or bruises, mild/mod discomfort(red)  Hold (Positioning): Assistance needed to correctly position infant at breast and maintain latch.  LATCH Score: 6  Interventions Interventions: Breast feeding basics reviewed;Support pillows;Assisted with latch;Position options;Skin to skin;Breast massage;Hand express;Shells;Pre-pump if needed;Breast compression;Adjust position;DEBP;Hand  pump  Lactation Tools Discussed/Used Tools: Shells;Pump;Nipple Shields Nipple shield size: 20 Shell Type: Inverted Breast pump type: Double-Electric Breast Pump;Manual WIC Program: No Pump Review: Setup, frequency, and cleaning;Milk Storage Initiated by:: Peri Jefferson RN IBCLC Date initiated:: 06/17/19   Consult Status Consult Status: Follow-up Date: 06/17/19(in pm) Follow-up type: In-patient    [name] 06/17/2019, 5:20 AM

## 2019-06-17 NOTE — Progress Notes (Signed)
Patient doing well  BP 106/69 (BP Location: Left Arm)   Pulse 78   Temp 97.7 F (36.5 C) (Oral)   Resp 20   Ht 5\' 2"  (1.575 m)   Wt 62.9 kg   SpO2 99%   Breastfeeding Unknown   BMI 25.36 kg/m  Results for orders placed or performed during the hospital encounter of 06/16/19 (from the past 24 hour(s))  CBC     Status: Abnormal   Collection Time: 06/17/19  5:01 AM  Result Value Ref Range   WBC 11.7 (H) 4.0 - 10.5 K/uL   RBC 3.80 (L) 3.87 - 5.11 MIL/uL   Hemoglobin 9.6 (L) 12.0 - 15.0 g/dL   HCT 06/19/19 (L) 74.6 - 00.2 %   MCV 81.8 80.0 - 100.0 fL   MCH 25.3 (L) 26.0 - 34.0 pg   MCHC 30.9 30.0 - 36.0 g/dL   RDW 98.4 73.0 - 85.6 %   Platelets 182 150 - 400 K/uL   nRBC 0.0 0.0 - 0.2 %   Abdomen is soft and non tender Lochia WNL  PPD # 1  Doing well Routine care Discharge home tomorrow

## 2019-06-17 NOTE — Progress Notes (Signed)
CSW received consult for history of situational anxiety.  CSW met with [name] to offer support and complete assessment.    [name] laying in bed with FOB present at bedside and doing STS with infant, when CSW entered the room. CSW introduced self and received verbal permission from [name] to complete assessment with FOB present. [name] and FOB both very pleasant and engaged throughout assessment. CSW inquired about [name]'s mental health history and [name] acknowledged situational anxiety prior to her pregnancy due to stressors including school and commuting. [name] stated she was previously on anxiety medications but stopped taking them when she found out she was pregnant. Per [name], previous situational stressors are no longer a concern and stated she feels good now. CSW provided education regarding the baby blues period vs. perinatal mood disorders, discussed treatment and gave resources for mental health follow up if concerns arise. CSW recommended self-evaluation during the postpartum time period using the New Mom Checklist from Postpartum Progress and encouraged [name] to contact a medical professional if symptoms are noted at any time. [name] not currently displaying any acute mental health symptoms and denied any current SI or HI. [name] reported having good support from FOB, his parents and her parents.   [name] and FOB confirmed having all essential items for infant once discharged and stated infant would be sleeping in a bassinet once home. CSW provided review of Sudden Infant Death Syndrome (SIDS) precautions and safe sleeping habits.    CSW identifies no further need for intervention and no barriers to discharge at this time.  Elijio Miles, LCSW [facility] [employer] [phone]

## 2019-06-17 NOTE — Anesthesia Postprocedure Evaluation (Signed)
Anesthesia Post Note  Patient: Margaret Lane  Procedure(s) Performed: AN AD HOC LABOR EPIDURAL     Patient location during evaluation: Mother Baby Anesthesia Type: Epidural Level of consciousness: awake and alert Pain management: pain level controlled Vital Signs Assessment: post-procedure vital signs reviewed and stable Respiratory status: spontaneous breathing Cardiovascular status: stable Postop Assessment: no headache, adequate PO intake, no backache, patient able to bend at knees, able to ambulate, epidural receding and no apparent nausea or vomiting Anesthetic complications: no    Last Vitals:  Vitals:   06/17/19 0101 06/17/19 0503  BP: 121/65 106/69  Pulse: 72 78  Resp: 20 20  Temp: 36.7 C 36.5 C  SpO2: 100% 99%    Last Pain:  Vitals:   06/17/19 0503  TempSrc: Oral  PainSc: 0-No pain   Pain Goal:                   Salome Arnt

## 2019-06-18 MED ORDER — IBUPROFEN 600 MG PO TABS: 600.0000 mg | ORAL_TABLET | Freq: Four times a day (QID) | ORAL | 0 refills | Status: DC | PRN

## 2019-06-18 NOTE — Discharge Summary (Signed)
Obstetric Discharge Summary Reason for Admission: induction of labor Prenatal Procedures: none Intrapartum Procedures: vacuum Postpartum Procedures: none Complications-Operative and Postpartum: none Hemoglobin  Date Value Ref Range Status  06/17/2019 9.6 (L) 12.0 - 15.0 g/dL Final   HCT  Date Value Ref Range Status  06/17/2019 31.1 (L) 36.0 - 46.0 % Final    Physical Exam:  General: alert, cooperative and no distress Lochia: appropriate Uterine Fundus: firm Incision: healing well DVT Evaluation: No evidence of DVT seen on physical exam.  Discharge Diagnoses: Term Pregnancy-delivered  Discharge Information: Date: 06/18/2019 Activity: pelvic rest Diet: routine Medications: PNV and Ibuprofen Condition: stable Instructions: refer to practice specific booklet Discharge to: home   Newborn Data: Live born female  Birth Weight: 6 lb 12.8 oz (3085 g) APGAR: 5, 8  Newborn Delivery   Birth date/time: 06/16/2017 14:07:00 Delivery type: Vaginal, Vacuum (Extractor)      Home with mother.  Margaret Lane II 06/18/2019, 6:57 AM

## 2019-06-18 NOTE — Lactation Note (Signed)
This note was copied from a baby's chart. Lactation Consultation Note  Patient Name: Margaret Lane [id number] Date: 06/18/2019 Reason for consult: Follow-up assessment  P1 mother whose infant is now 5 hours old.  Family is packing up their belongings and getting ready for discharge.  Baby was quiet and awake in the bassinet.  Mother had some questions regarding breast feeding and NS use.  She was happy to report that the baby is doing quite well using the NS and wanted to know if she can continue using the NS.  I suggested she use the NS as long as she needs until her baby is latching and feeding well.  Reminded mother to post pump after NS feedings to help maintain milk supply.  Feeding plan for home discussed.  Mother states she has no soreness breast feeding when using the NS.  She is also using coconut oil for comfort.  Mother has a DEBP for home use.  Engorgement prevention/treatment reviewed.  Suggested mother make an OP LC visit as needed; explained the process and what to expect at this visit if she decides to follow up.  Also informed her on how to contact us for any further questions/concerns after discharge; another brochure given in case she cannot find the original brochure.  Father present and supportive.  RN has completed discharge instructions.  Mother is planning on pumping one more time before leaving.  Reminded her to take all her pump parts with her.  Family appreciative of assistance.   Maternal Data    Feeding Feeding Type: Breast Fed  LATCH Score                   Interventions    Lactation Tools Discussed/Used     Consult Status Consult Status: Complete Date: 06/18/19 Follow-up type: Call as needed    Margaret Lane R Margaret Lane 06/18/2019, 11:45 AM

## 2019-06-18 NOTE — Progress Notes (Signed)
Post Partum Day 2 Subjective: no complaints, up ad lib, voiding, tolerating PO and + flatus  Objective: Blood pressure 113/61, pulse 100, temperature 98.8 F (37.1 C), temperature source Oral, resp. rate 19, height 5\' 2"  (1.575 m), weight 62.9 kg, SpO2 98 %, unknown if currently breastfeeding.  Physical Exam:  General: alert, cooperative and no distress Lochia: appropriate Uterine Fundus: firm Incision: healing well DVT Evaluation: No evidence of DVT seen on physical exam.  Recent Labs    06/16/19 0033 06/17/19 0501  HGB 10.6* 9.6*  HCT 34.5* 31.1*    Assessment/Plan: Discharge home   LOS: 2 days   06/19/19 II 06/18/2019, 6:56 AM

## 2019-08-09 ENCOUNTER — Ambulatory Visit: Payer: Self-pay

## 2019-08-09 NOTE — Lactation Note (Signed)
This note was copied from a baby's chart. Lactation Consultation Note  Patient Name: Margaret Lane [id number] Date: 08/09/2019    08/09/2019  Name: Margaret Lane MRN: [id number] Date of Birth: 06/16/2017 Gestational Age: Gestational Age: [redacted]w[redacted]d Birth Weight: 108.8 oz Weight today:  Weight: 9 lb 8.6 oz (7010 g)   65 week old term infant presents today with mom for feeding assessment.   Infant has gained 1381 grams in the last 52 days with an average daily weight gain of 27 grams a day. Infant has decreased some on her growth curve. She has follow up Peds appt on 3/15.   Infant is using a # 20 NS with each feeding. She reports she had a lot of pain in the hospital and then started the NS and it made a big difference. Her pediatrician gave mom a # 24 NS and mom was not able to keep it on. She went back to the # 20.    Infant will latch better to the left. Infant frustrated at the breast and pulls on and off and fights the breast. Mom had been feeding infant on one breast and the use the [facility] on the other breast. She is now using the [facility] for 1 minute to stimulate letdown and lay back with feeding and that has helped. Infant spits up a lot. Mom reports infant softens both breasts well with feeding. Milk flow is less on the left breast and infant tolerates it better.   Recently Mom was pumping in the morning when she awakened and has since stopped since talking with LC. For 2-3 weeks post discharge she was pumping after every feeding and then stopped. She was then only pumping once a day and getting 10 ounces about an hour after she fed.  Mom has a lot of milk in the freezer.   Mom alternates every other breast for each feeding. Infant empties the breast well. Since laying back, infant is not pulling on and off and screaming while at the breast. Infant spits with each feeding per mom. Infant is spitting less since she has stopped pumping.   We were able to take the NS off for part  of the feeding. Mom did not feel pain but some slight discomfort.   Infant with thin labial frenulum that inserts at the bottom of the gum ridge. Upper lip with some resistance to flanging, upper lip flanges on the breast. Infant chokes some on the breast. She needs the # 20 NS with feedings to maintain latch. Mom's nipples are flat at rest with some elasticity with feeding. Infant with posterior lingual frenulum noted. She has a strong suckle on gloved finger with good tongue cupping and extension. Infant with some decreased mid tongue elevation. Reviewed with mom to work to get infant off the NS. If infant not able to wean off the NS in the next few weeks may be helpful to have infant evaluated by an Oral Specialist. Reviewed that some latching issues may very well be due to her being a first time mom and inelastic nipple tissue. Attempted to take NS off 1/2 way through the feeding, she pulled on and off the breast. We then placed the # 24 NS and infant finished feeding. Nipple was slightly compressed post feeding. Reviewed with mom how tongue and lip restrictions can effect milk supply and milk transfer over time. Website and local provider information given. Infant drools from the breast when feeding. Mom to research and call back as  needed.   Discussed that since infant is doing better since mom stopped pumping then I am hesitant to recommend pumping although it is common to recommend pumping 2-3 times a day with oral restrictions are known and when using a NS. Reviewed pre pumping as needed when really full. And is weight is down on Monday then I would recommend she resume the once a day pumping.   Infant will take a bottle about every other day. She will take about 6 ounces in a bottle. Mom reports she does not feel like infant is paced bottle fed. Shared benefit of paced bottle feeding. Mom reports infant does pretty well with the Avent Natural Flow Nipple. She did not do as well with another Level 1  nipple.   Infant to follow up with Dr. Mariam Dollar on Monday 3/15. Infant to follow up with Lactation as needed or 1-5 days post tongue and lip releases if completed. Mom to call as needed.   Mom is in school for Physical Therapy Assistant.   General Information: Mother's reason for visit: Feeding assessment, choking on the breast Consult: Initial Lactation consultant: Noralee Stain RN,IBCLC Breastfeeding experience: latching and feeding well with the # 20 NS   Maternal medications: Pre-natal vitamin, Other(Progesterone only OCP's)  Breastfeeding History: Frequency of breast feeding: every 3-5 hours Duration of feeding: 10-40 minutes  Supplementation: Supplement method: bottle(Avent, natural Flow)         Breast milk volume: 6 ounces Breast milk frequency: every other night, dad feeds her   Pump type: Medela pump in style(haakaa) Pump frequency: 1 x a day Pump volume: 3-4 ounces in the Haakaa, 10 ounces in the pump  Infant Output Assessment: Voids per 24 hours: 10-12 Urine color: Clear yellow Stools per 24 hours: 8-10 Stool color: Green  Breast Assessment: Breast: Soft, Compressible Nipple: Flat Pain level: 0 Pain interventions: Bra, Breast pump, Nipple shield  Feeding Assessment: Infant oral assessment: Variance Infant oral assessment comment: see note Positioning: Cradle(20 minutes, left breast) Latch: 2 - Grasps breast easily, tongue down, lips flanged, rhythmical sucking. Audible swallowing: 2 - Spontaneous and intermittent Type of nipple: 2 - Everted at rest and after stimulation Comfort: 2 - Soft/non-tender Hold: 2 - No assistance needed to correctly position infant at breast LATCH score: 10 Latch assessment: Deep Lips flanged: Yes Suck assessment: Displays both Tools: Nipple shield 20 mm, Nipple shield 24 mm(changed 1/2 way through feeding) Pre-feed weight: 4326 grams Post feed weight: 4440 grams Amount transferred: 114 ml Amount supplemented:  0  Additional Feeding Assessment:                                    Totals: Total amount transferred: 114 ml Total supplement given: 0 Total amount pumped post feed: did not pump   Plan: 1. Offer infant the breast with feeding cues 2. Stimulate infant as needed to maintain active suckling 3. Feed infant skin to skin 4. Feeding infant in the laid back position to help with milk flow 5. Use the # 24 Nipple shield with feeding as needed to maintain latch. Goal is to wean off the Nipple Shield as soon as infant is able. Try by starting the feeding with it and taking off about 1/2 way through the feeding.  6. Keep infant upright for 15-20 minutes after feeding to help with spitting 7. When offering the bottle, use the paced bottle feeding method (video on [email or web address].[email or web address])  8. Infant needs about 80-108 ml (3-3.5 ounces) for 8 feeds a day or 645-860 ml (22-29 ounces) in 24 hours. Infant may take more or less with each feeding depending on how often she feeds. Feed infant until she is satisfied.  9. Continue with the Avent Natural Flow nipple for bottle feeding as you have been 10. Would recommend pre pumping to get letdown off as needed when really full to help infant with flow and to pump once a day to empty the breast due to tongue and lip restrictions.  11. Keep up the good work  12. Thank you for allowing me to assist you today 13. Please call with any questions/concerns as needed [phone] 14. Follow up Lactation as needed or 1-5 days post tongue and lip releases if completed.     Silas Flood Braxston Quinter RN, IBCLC                                                       Silas Flood Menno Vanbergen 08/09/2019, 10:29 AM

## 2020-06-03 NOTE — L&D Delivery Note (Signed)
Delivery Note At 4:32 PM a viable female was delivered via OA Position APGAR: ,8 9 weight pending .   Placenta status:  spontaneously with 3 vessel cord ,  .  Cord:   with the following complications:  none .  Cord pH: not obtained  Anesthesia: Epidural Episiotomy:  none Lacerations:  none Suture Repair:  none Est. Blood Loss (mL): 300   Mom to postpartum.  Baby to Couplet care / Skin to Skin.  Jeani Hawking 01/11/2021, 4:43 PM

## 2020-06-06 LAB — OB RESULTS CONSOLE ANTIBODY SCREEN: Antibody Screen: NEGATIVE

## 2020-06-06 LAB — OB RESULTS CONSOLE ABO/RH: RH Type: POSITIVE

## 2020-06-06 LAB — OB RESULTS CONSOLE RPR: RPR: NONREACTIVE

## 2020-06-06 LAB — OB RESULTS CONSOLE HIV ANTIBODY (ROUTINE TESTING): HIV: NONREACTIVE

## 2020-06-06 LAB — OB RESULTS CONSOLE GC/CHLAMYDIA
Chlamydia: NEGATIVE
Gonorrhea: NEGATIVE

## 2020-06-06 LAB — OB RESULTS CONSOLE HEPATITIS B SURFACE ANTIGEN: Hepatitis B Surface Ag: NEGATIVE

## 2020-06-06 LAB — OB RESULTS CONSOLE RUBELLA ANTIBODY, IGM: Rubella: IMMUNE

## 2020-12-19 ENCOUNTER — Other Ambulatory Visit: Payer: Self-pay

## 2020-12-19 ENCOUNTER — Encounter (HOSPITAL_COMMUNITY): Payer: Self-pay | Admitting: Obstetrics and Gynecology

## 2020-12-19 ENCOUNTER — Inpatient Hospital Stay (HOSPITAL_COMMUNITY)
Admission: AD | Admit: 2020-12-19 | Discharge: 2020-12-20 | Disposition: A | Discharge status: Home / Self Care | Payer: BC Managed Care – PPO | Attending: Obstetrics and Gynecology | Admitting: Obstetrics and Gynecology

## 2020-12-19 DIAGNOSIS — Z3A36 36 weeks gestation of pregnancy: Secondary | ICD-10-CM | POA: Insufficient documentation

## 2020-12-19 DIAGNOSIS — Z8616 Personal history of COVID-19: Secondary | ICD-10-CM | POA: Insufficient documentation

## 2020-12-19 DIAGNOSIS — R0602 Shortness of breath: Secondary | ICD-10-CM | POA: Insufficient documentation

## 2020-12-19 DIAGNOSIS — R000 Tachycardia, unspecified: Secondary | ICD-10-CM | POA: Insufficient documentation

## 2020-12-19 DIAGNOSIS — O26893 Other specified pregnancy related conditions, third trimester: Secondary | ICD-10-CM | POA: Insufficient documentation

## 2020-12-19 LAB — OB RESULTS CONSOLE GBS: GBS: NEGATIVE

## 2020-12-19 NOTE — MAU Note (Addendum)
Pt reports that her apple watch has shown that her heart rate is in the 120's-130's all day. Pt called her doctor and was told to come into the office and was seen today. Pt reports she was told to drink more water and go back to work if she wanted too. Pt reports she returned back to work and did nothing, but sit at her desk and she still felt the way she does. Pt reports feeling like she has been running around all day.  Pt reports she just had COVID in June 2022

## 2020-12-20 DIAGNOSIS — R0602 Shortness of breath: Secondary | ICD-10-CM | POA: Diagnosis not present

## 2020-12-20 DIAGNOSIS — R000 Tachycardia, unspecified: Secondary | ICD-10-CM | POA: Diagnosis not present

## 2020-12-20 DIAGNOSIS — O26893 Other specified pregnancy related conditions, third trimester: Secondary | ICD-10-CM | POA: Diagnosis not present

## 2020-12-20 DIAGNOSIS — Z3A36 36 weeks gestation of pregnancy: Secondary | ICD-10-CM

## 2020-12-20 DIAGNOSIS — Z8616 Personal history of COVID-19: Secondary | ICD-10-CM | POA: Diagnosis not present

## 2020-12-20 LAB — URINALYSIS, ROUTINE W REFLEX MICROSCOPIC
Bilirubin Urine: NEGATIVE
Glucose, UA: 50 mg/dL — AB
Hgb urine dipstick: NEGATIVE
Ketones, ur: NEGATIVE mg/dL
Leukocytes,Ua: NEGATIVE
Nitrite: NEGATIVE
Protein, ur: NEGATIVE mg/dL
Specific Gravity, Urine: 1.01 (ref 1.005–1.030)
pH: 8 (ref 5.0–8.0)

## 2020-12-20 NOTE — MAU Provider Note (Signed)
Chief Complaint:  Shortness of Breath and Tachycardia (Pt reports )   Event Date/Time   First Provider Initiated Contact with Patient 12/20/20 0038      HPI: Margaret Lane is a 30 y.o. G2P1001 at [redacted]w[redacted]d who presents to maternity admissions reporting feeling short of breath and like her heart is racing intermittently today. Her shortness of breath started over 1 week ago, following her recovery from COVID, and has been constant but mild.  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  She sat down and rested but still felt out of breath and "like I have been running".  She called her OB office and was seen today.  Her heart rate was normal there but labs were drawn. She returned to work and reports symptoms continued.  She called again and was told to come to MAU for further evaluation.   She reports good fetal movement and denies abdominal pain.      HPI  Past Medical History: Past Medical History:  Diagnosis Date   Anxiety    situational   Medical history non-contributory    Vaginal Pap smear, abnormal     Past obstetric history: OB History  Gravida Para Term Preterm AB Living  2 1 1  0 0 1  SAB IAB Ectopic Multiple Live Births  0 0 0 0 1    # Outcome Date GA Lbr Len/2nd Weight Sex Delivery Anes PTL Lv  2 Current           1 Term 06/16/19 [redacted]w[redacted]d 04:52 / 00:31 3085 g F Vag-Vacuum EPI  LIV    Past Surgical History: Past Surgical History:  Procedure Laterality Date   dilation of urethra     as a child   NO PAST SURGERIES      Family History: Family History  Problem Relation Age of Onset   Diabetes Father     Social History: Social History   Tobacco Use   Smoking status: Never   Smokeless tobacco: Never  Vaping Use   Vaping Use: Never used  Substance Use Topics   Alcohol use: Never   Drug use: Never    Allergies:  Allergies   Allergen Reactions   Flonase [Fluticasone Propionate] Anaphylaxis    "itching in throat and felt like throat was closing making it difficult to breathe."    Meds:  Medications Prior to Admission  Medication Sig Dispense Refill Last Dose   acetaminophen (TYLENOL) 500 MG tablet Take 500 mg by mouth every 6 (six) hours as needed for headache.      ibuprofen (ADVIL) 600 MG tablet Take 1 tablet (600 mg total) by mouth every 6 (six) hours as needed. 60 tablet 0    Prenatal Vit-Fe Fumarate-FA (PRENATAL MULTIVITAMIN) TABS tablet Take 1 tablet by mouth at bedtime.       ROS:  Review of Systems   I have reviewed patient's Past Medical Hx, Surgical Hx, Family Hx, Social Hx, medications and allergies.   Physical Exam  Patient Vitals for the past 24 hrs:  BP Temp Temp src Pulse Resp SpO2  12/20/20 0110 115/68 98.5 F (36.9 C) Oral 87 18 99 %  12/19/20 2325 124/69 98 F (36.7 C) -- (!) 110 16 96 %   Constitutional: Well-developed, well-nourished female in no acute distress.  Cardiovascular: normal rate Respiratory: normal effort  GI: Abd soft, non-tender, gravid appropriate for gestational age.  MS: Extremities nontender, no edema, normal ROM Neurologic: Alert and oriented x 4.  GU: Neg CVAT.  PELVIC EXAM: Cervix pink, visually closed, without lesion, scant white creamy discharge, vaginal walls and external genitalia normal Bimanual exam: Cervix 0/long/high, firm, anterior, neg CMT, uterus nontender, nonenlarged, adnexa without tenderness, enlargement, or mass     FHT:  Baseline 125 , moderate variability, accelerations present, no decelerations Contractions: none on toco or to palpation   Labs: No results found for this or any previous visit (from the past 24 hour(s)).    Imaging:  No results found.  MAU Course/MDM: Orders Placed This Encounter  Procedures   Urinalysis, Routine w reflex microscopic Urine, Clean Catch   ED EKG   Discharge patient    No orders of the  defined types were placed in this encounter.    NST reviewed and appropriate for gestational age EKG done in MAU with normal sinus rhythm, vent rate 95 No acute findings.  SOB for over 1 week, no tachycardia in MAU, O2 sat 100% on RA, unlikely PE.   Pt with labwork pending from her office visit today including TSH per pt.  She has close outpatient f/u with appointment with Dr Rana Snare on 12/21/20.   Pt to be out of work until after her appt in the office D/C home with return precautions   Assessment: 1. Shortness of breath due to pregnancy in third trimester   2. Tachycardia   3. [redacted] weeks gestation of pregnancy     Plan: Discharge home Labor precautions and fetal kick counts  Follow-up Information     Candice Camp, MD Follow up.   Specialty: Obstetrics and Gynecology Why: As scheduled on 12/21/20 Contact information: [address] [place], [address] [place] [address] [phone]         [facility] Follow up.   Specialty: Obstetrics and Gynecology Why: If symptoms worsen or as needed for emergencies Contact information: [address] [id number] mc [address] [place] [address] [phone]               Allergies as of 12/20/2020       Reactions   Flonase [fluticasone Propionate] Anaphylaxis   "itching in throat and felt like throat was closing making it difficult to breathe."        Medication List     STOP taking these medications    ibuprofen 600 MG tablet Commonly known as: ADVIL       TAKE these medications    acetaminophen 500 MG tablet Commonly known as: TYLENOL Take 500 mg by mouth every 6 (six) hours as needed for headache.   prenatal multivitamin Tabs tablet Take 1 tablet by mouth at bedtime.        Sharen Counter Certified Nurse-Midwife 12/20/2020 1:13 AM

## 2021-01-04 ENCOUNTER — Telehealth (HOSPITAL_COMMUNITY): Payer: Self-pay | Admitting: *Deleted

## 2021-01-04 NOTE — Telephone Encounter (Signed)
Preadmission screen  

## 2021-01-05 ENCOUNTER — Encounter (HOSPITAL_COMMUNITY): Payer: Self-pay | Admitting: *Deleted

## 2021-01-05 ENCOUNTER — Telehealth (HOSPITAL_COMMUNITY): Payer: Self-pay | Admitting: *Deleted

## 2021-01-05 NOTE — Telephone Encounter (Signed)
Preadmission screen  

## 2021-01-11 ENCOUNTER — Other Ambulatory Visit: Payer: Self-pay

## 2021-01-11 ENCOUNTER — Inpatient Hospital Stay (HOSPITAL_COMMUNITY): Payer: BC Managed Care – PPO | Admitting: Anesthesiology

## 2021-01-11 ENCOUNTER — Encounter (HOSPITAL_COMMUNITY): Payer: Self-pay | Admitting: Obstetrics and Gynecology

## 2021-01-11 ENCOUNTER — Inpatient Hospital Stay (HOSPITAL_COMMUNITY): Payer: BC Managed Care – PPO

## 2021-01-11 ENCOUNTER — Inpatient Hospital Stay (HOSPITAL_COMMUNITY)
Admission: AD | Admit: 2021-01-11 | Discharge: 2021-01-13 | DRG: 807 | Disposition: A | Discharge status: Home / Self Care | Payer: BC Managed Care – PPO | Attending: Obstetrics and Gynecology | Admitting: Obstetrics and Gynecology

## 2021-01-11 DIAGNOSIS — O26893 Other specified pregnancy related conditions, third trimester: Secondary | ICD-10-CM | POA: Diagnosis present

## 2021-01-11 DIAGNOSIS — Z3490 Encounter for supervision of normal pregnancy, unspecified, unspecified trimester: Secondary | ICD-10-CM

## 2021-01-11 DIAGNOSIS — Z3A39 39 weeks gestation of pregnancy: Secondary | ICD-10-CM

## 2021-01-11 DIAGNOSIS — Z8616 Personal history of COVID-19: Secondary | ICD-10-CM | POA: Diagnosis not present

## 2021-01-11 LAB — CBC
HCT: 38.8 % (ref 36.0–46.0)
Hemoglobin: 13.1 g/dL (ref 12.0–15.0)
MCH: 29.8 pg (ref 26.0–34.0)
MCHC: 33.8 g/dL (ref 30.0–36.0)
MCV: 88.4 fL (ref 80.0–100.0)
Platelets: 147 10*3/uL — ABNORMAL LOW (ref 150–400)
RBC: 4.39 MIL/uL (ref 3.87–5.11)
RDW: 13.5 % (ref 11.5–15.5)
WBC: 7.9 10*3/uL (ref 4.0–10.5)
nRBC: 0 % (ref 0.0–0.2)

## 2021-01-11 LAB — TYPE AND SCREEN
ABO/RH(D): O POS
Antibody Screen: NEGATIVE

## 2021-01-11 LAB — RPR: RPR Ser Ql: NONREACTIVE

## 2021-01-11 MED ORDER — MEASLES, MUMPS & RUBELLA VAC IJ SOLR: 0.5000 mL | Freq: Once | INTRAMUSCULAR | Status: DC

## 2021-01-11 MED ORDER — FENTANYL-BUPIVACAINE-NACL 0.5-0.125-0.9 MG/250ML-% EP SOLN
12.0000 mL/h | EPIDURAL | Status: DC | PRN
Start: 2021-01-11 — End: 2021-01-11
  Administered 2021-01-11: 12 mL/h via EPIDURAL
  Filled 2021-01-11: qty 250

## 2021-01-11 MED ORDER — DIPHENHYDRAMINE HCL 50 MG/ML IJ SOLN: 12.5000 mg | INTRAMUSCULAR | Status: DC | PRN

## 2021-01-11 MED ORDER — COCONUT OIL OIL: 1.0000 "application " | TOPICAL_OIL | Status: DC | PRN

## 2021-01-11 MED ORDER — TETANUS-DIPHTH-ACELL PERTUSSIS 5-2.5-18.5 LF-MCG/0.5 IM SUSY: 0.5000 mL | PREFILLED_SYRINGE | Freq: Once | INTRAMUSCULAR | Status: DC

## 2021-01-11 MED ORDER — ONDANSETRON HCL 4 MG PO TABS: 4.0000 mg | ORAL_TABLET | ORAL | Status: DC | PRN

## 2021-01-11 MED ORDER — OXYTOCIN-SODIUM CHLORIDE 30-0.9 UT/500ML-% IV SOLN
1.0000 m[IU]/min | INTRAVENOUS | Status: DC
  Administered 2021-01-11: 2 m[IU]/min via INTRAVENOUS
  Filled 2021-01-11: qty 500

## 2021-01-11 MED ORDER — LACTATED RINGERS IV SOLN: 500.0000 mL | Freq: Once | INTRAVENOUS | Status: DC

## 2021-01-11 MED ORDER — PHENYLEPHRINE 40 MCG/ML (10ML) SYRINGE FOR IV PUSH (FOR BLOOD PRESSURE SUPPORT)
80.0000 ug | PREFILLED_SYRINGE | INTRAVENOUS | Status: DC | PRN
  Filled 2021-01-11: qty 10

## 2021-01-11 MED ORDER — OXYCODONE HCL 5 MG PO TABS: 10.0000 mg | ORAL_TABLET | ORAL | Status: DC | PRN

## 2021-01-11 MED ORDER — LIDOCAINE HCL (PF) 1 % IJ SOLN: 30.0000 mL | INTRAMUSCULAR | Status: DC | PRN

## 2021-01-11 MED ORDER — ACETAMINOPHEN 325 MG PO TABS: 650.0000 mg | ORAL_TABLET | ORAL | Status: DC | PRN

## 2021-01-11 MED ORDER — MISOPROSTOL 25 MCG QUARTER TABLET: 25.0000 ug | ORAL_TABLET | ORAL | Status: DC | PRN

## 2021-01-11 MED ORDER — FLEET ENEMA 7-19 GM/118ML RE ENEM: 1.0000 | ENEMA | Freq: Every day | RECTAL | Status: DC | PRN

## 2021-01-11 MED ORDER — OXYCODONE-ACETAMINOPHEN 5-325 MG PO TABS: 1.0000 | ORAL_TABLET | ORAL | Status: DC | PRN

## 2021-01-11 MED ORDER — MEDROXYPROGESTERONE ACETATE 150 MG/ML IM SUSP: 150.0000 mg | INTRAMUSCULAR | Status: DC | PRN

## 2021-01-11 MED ORDER — LIDOCAINE HCL (PF) 1 % IJ SOLN
INTRAMUSCULAR | Status: DC | PRN
  Administered 2021-01-11: 6 mL via EPIDURAL

## 2021-01-11 MED ORDER — OXYCODONE-ACETAMINOPHEN 5-325 MG PO TABS
2.0000 | ORAL_TABLET | ORAL | Status: DC | PRN
Start: 2021-01-11 — End: 2021-01-11

## 2021-01-11 MED ORDER — OXYTOCIN BOLUS FROM INFUSION
333.0000 mL | Freq: Once | INTRAVENOUS | Status: AC
  Administered 2021-01-11: 333 mL via INTRAVENOUS

## 2021-01-11 MED ORDER — SOD CITRATE-CITRIC ACID 500-334 MG/5ML PO SOLN: 30.0000 mL | ORAL | Status: DC | PRN

## 2021-01-11 MED ORDER — ONDANSETRON HCL 4 MG/2ML IJ SOLN: 4.0000 mg | Freq: Four times a day (QID) | INTRAMUSCULAR | Status: DC | PRN

## 2021-01-11 MED ORDER — ONDANSETRON HCL 4 MG/2ML IJ SOLN: 4.0000 mg | INTRAMUSCULAR | Status: DC | PRN

## 2021-01-11 MED ORDER — DIPHENHYDRAMINE HCL 25 MG PO CAPS: 25.0000 mg | ORAL_CAPSULE | Freq: Four times a day (QID) | ORAL | Status: DC | PRN

## 2021-01-11 MED ORDER — SENNOSIDES-DOCUSATE SODIUM 8.6-50 MG PO TABS
2.0000 | ORAL_TABLET | Freq: Every day | ORAL | Status: DC
  Filled 2021-01-11 (×2): qty 2

## 2021-01-11 MED ORDER — LIDOCAINE-EPINEPHRINE (PF) 1.5 %-1:200000 IJ SOLN
INTRAMUSCULAR | Status: DC | PRN
  Administered 2021-01-11: 3 mL via EPIDURAL

## 2021-01-11 MED ORDER — IBUPROFEN 600 MG PO TABS
600.0000 mg | ORAL_TABLET | Freq: Four times a day (QID) | ORAL | Status: DC
  Filled 2021-01-11 (×5): qty 1

## 2021-01-11 MED ORDER — ZOLPIDEM TARTRATE 5 MG PO TABS: 5.0000 mg | ORAL_TABLET | Freq: Every evening | ORAL | Status: DC | PRN

## 2021-01-11 MED ORDER — OXYTOCIN-SODIUM CHLORIDE 30-0.9 UT/500ML-% IV SOLN: 2.5000 [IU]/h | INTRAVENOUS | Status: DC

## 2021-01-11 MED ORDER — PHENYLEPHRINE 40 MCG/ML (10ML) SYRINGE FOR IV PUSH (FOR BLOOD PRESSURE SUPPORT)
80.0000 ug | PREFILLED_SYRINGE | INTRAVENOUS | Status: DC | PRN
  Administered 2021-01-11 (×2): 80 ug via INTRAVENOUS

## 2021-01-11 MED ORDER — HYDROXYZINE HCL 25 MG PO TABS
50.0000 mg | ORAL_TABLET | Freq: Three times a day (TID) | ORAL | Status: DC | PRN
  Administered 2021-01-11: 50 mg via ORAL
  Filled 2021-01-11: qty 1

## 2021-01-11 MED ORDER — EPHEDRINE 5 MG/ML INJ: 10.0000 mg | INTRAVENOUS | Status: DC | PRN

## 2021-01-11 MED ORDER — LACTATED RINGERS IV SOLN: INTRAVENOUS | Status: DC

## 2021-01-11 MED ORDER — LACTATED RINGERS IV SOLN: 500.0000 mL | INTRAVENOUS | Status: DC | PRN

## 2021-01-11 MED ORDER — TERBUTALINE SULFATE 1 MG/ML IJ SOLN: 0.2500 mg | Freq: Once | INTRAMUSCULAR | Status: DC | PRN

## 2021-01-11 MED ORDER — WITCH HAZEL-GLYCERIN EX PADS: 1.0000 "application " | MEDICATED_PAD | CUTANEOUS | Status: DC | PRN

## 2021-01-11 MED ORDER — SIMETHICONE 80 MG PO CHEW: 80.0000 mg | CHEWABLE_TABLET | ORAL | Status: DC | PRN

## 2021-01-11 MED ORDER — BISACODYL 10 MG RE SUPP: 10.0000 mg | Freq: Every day | RECTAL | Status: DC | PRN

## 2021-01-11 MED ORDER — OXYCODONE HCL 5 MG PO TABS: 5.0000 mg | ORAL_TABLET | ORAL | Status: DC | PRN

## 2021-01-11 MED ORDER — BENZOCAINE-MENTHOL 20-0.5 % EX AERO: 1.0000 "application " | INHALATION_SPRAY | CUTANEOUS | Status: DC | PRN

## 2021-01-11 MED ORDER — DIBUCAINE (PERIANAL) 1 % EX OINT: 1.0000 "application " | TOPICAL_OINTMENT | CUTANEOUS | Status: DC | PRN

## 2021-01-11 MED ORDER — PRENATAL MULTIVITAMIN CH
1.0000 | ORAL_TABLET | Freq: Every day | ORAL | Status: DC
  Filled 2021-01-11 (×2): qty 1

## 2021-01-11 NOTE — H&P (Signed)
Margaret Lane is a 30 y.o. G 2 P 1001 at 5 w 3 days presents for IOL. Received epidural OB History     Gravida  2   Para  1   Term  1   Preterm  0   AB  0   Living  1      SAB  0   IAB  0   Ectopic  0   Multiple  0   Live Births  1          Past Medical History:  Diagnosis Date   Anxiety    situational   Medical history non-contributory    Tachycardia    from covid in June   Vaginal Pap smear, abnormal    Past Surgical History:  Procedure Laterality Date   dilation of urethra     as a child   NO PAST SURGERIES     Family History: family history includes Breast cancer in her mother; Diabetes in her father. Social History:  reports that she has never smoked. She has never used smokeless tobacco. She reports that she does not drink alcohol and does not use drugs.     Maternal Diabetes: No Genetic Screening: Normal Maternal Ultrasounds/Referrals: Normal Fetal Ultrasounds or other Referrals:  None Maternal Substance Abuse:  No Significant Maternal Medications:  None Significant Maternal Lab Results:  Group B Strep negative Other Comments:  None  Review of Systems  All other systems reviewed and are negative. History Dilation: 3.5 Effacement (%): 80 Station: -2 Exam by:: Kishon Garriga Blood pressure 115/70, pulse 97, temperature 98.6 F (37 C), temperature source Oral, resp. rate 16, height 5\' 2"  (1.575 m), weight 68.5 kg, SpO2 99 %, unknown if currently breastfeeding. Exam Physical Exam Vitals and nursing note reviewed. Exam conducted with a chaperone present.  Constitutional:      Appearance: Normal appearance. She is normal weight.  Cardiovascular:     Rate and Rhythm: Normal rate and regular rhythm.  Pulmonary:     Effort: Pulmonary effort is normal.  Musculoskeletal:     Cervical back: Normal range of motion.  Neurological:     Mental Status: She is alert.    Prenatal labs: ABO, Rh: --/--/O POS (08/11 0820) Antibody: NEG (08/11  0820) Rubella: Immune (01/04 0000) RPR: NON REACTIVE (08/11 0701)  HBsAg: Negative (01/04 0000)  HIV: Non-reactive (01/04 0000)  GBS: Negative/-- (07/19 0000)   Assessment/Plan: IUP at 39 w 3 days IOL Pitocin per protocol Anticipate NSVD  06-03-1987 01/11/2021, 12:31 PM

## 2021-01-11 NOTE — Lactation Note (Signed)
This note was copied from a baby's chart. Lactation Consultation Note  Patient Name: Margaret Lane [id number] Date: 01/11/2021 Reason for consult: L&D Initial assessment;Mother's request;Term;Nipple pain/trauma Age: < 1 hr   LC noted infant latched on both breasts prior to [facility] arrival. LC worked to flange out lips, bruise noted on right breast and tenderness on left. LC attempted deeper latch in laid back, mother already sore.   LC helped parents hand express 6 ml of colostrum via spoon. Mom to get breast shells to use once on the floor.  All questions answered at the end of the visit.  Mom to receive further LC support once on the floor.   Maternal Data Has patient been taught Hand Expression?: Yes Does the patient have breastfeeding experience prior to this delivery?: Yes How long did the patient breastfeed?: used a nipple shield for 3 months and pumped and bottle fed for  68months  Feeding Mother's Current Feeding Choice: Breast Milk  LATCH Score Latch: Repeated attempts needed to sustain latch, nipple held in mouth throughout feeding, stimulation needed to elicit sucking reflex.  Audible Swallowing: A few with stimulation  Type of Nipple: Flat  Comfort (Breast/Nipple): Filling, red/small blisters or bruises, mild/mod discomfort  Hold (Positioning): Assistance needed to correctly position infant at breast and maintain latch.  LATCH Score: 5   Lactation Tools Discussed/Used    Interventions Interventions: Breast feeding basics reviewed;Breast compression;Assisted with latch;Adjust position;Skin to skin;Support pillows;Breast massage;Position options;Hand express;Expressed milk;Education (mom need breast shells to help bring out her nipples.)  Discharge Pump: Personal  Consult Status Consult Status: Follow-up Date: 01/12/21 Follow-up type: In-patient    Tinsley Lomas  Nicholson-Springer 01/11/2021, 5:34 PM

## 2021-01-11 NOTE — Anesthesia Procedure Notes (Signed)
Epidural Patient location during procedure: OB Start time: 01/11/2021 9:55 AM End time: 01/11/2021 10:10 AM  Staffing Anesthesiologist: Achille Rich, MD Performed: anesthesiologist   Preanesthetic Checklist Completed: patient identified, IV checked, site marked, risks and benefits discussed, monitors and equipment checked, pre-op evaluation and timeout performed  Epidural Patient position: sitting Prep: DuraPrep Patient monitoring: heart rate, cardiac monitor, continuous pulse ox and blood pressure Approach: midline Location: L2-L3 Injection technique: LOR saline  Needle:  Needle type: Tuohy  Needle gauge: 17 G Needle length: 9 cm Needle insertion depth: 6 cm Catheter type: closed end flexible Catheter size: 19 Gauge Catheter at skin depth: 12 cm Test dose: negative and Other  Assessment Events: blood not aspirated, injection not painful, no injection resistance and negative IV test  Additional Notes Informed consent obtained prior to proceeding including risk of failure, 1% risk of PDPH, risk of minor discomfort and bruising.  Discussed rare but serious complications including epidural abscess, permanent nerve injury, epidural hematoma.  Discussed alternatives to epidural analgesia and patient desires to proceed.  Timeout performed pre-procedure verifying patient name, procedure, and platelet count.  Patient tolerated procedure well. Reason for block:procedure for pain

## 2021-01-11 NOTE — Anesthesia Preprocedure Evaluation (Signed)
Anesthesia Evaluation  Patient identified by MRN, date of birth, ID band Patient awake    Reviewed: Allergy & Precautions, H&P , NPO status , Patient's Chart, lab work & pertinent test results  Airway Mallampati: II   Neck ROM: full    Dental   Pulmonary neg pulmonary ROS,    breath sounds clear to auscultation       Cardiovascular negative cardio ROS   Rhythm:regular Rate:Normal     Neuro/Psych    GI/Hepatic   Endo/Other    Renal/GU      Musculoskeletal   Abdominal   Peds  Hematology   Anesthesia Other Findings   Reproductive/Obstetrics (+) Pregnancy                             Anesthesia Physical Anesthesia Plan  ASA: 2  Anesthesia Plan: Epidural   Post-op Pain Management:    Induction: Intravenous  PONV Risk Score and Plan: 2 and Treatment may vary due to age or medical condition  Airway Management Planned: Natural Airway  Additional Equipment:   Intra-op Plan:   Post-operative Plan:   Informed Consent: I have reviewed the patients History and Physical, chart, labs and discussed the procedure including the risks, benefits and alternatives for the proposed anesthesia with the patient or authorized representative who has indicated his/her understanding and acceptance.     Dental advisory given  Plan Discussed with: CRNA and Anesthesiologist  Anesthesia Plan Comments:         Anesthesia Quick Evaluation

## 2021-01-11 NOTE — Lactation Note (Addendum)
This note was copied from a baby's chart. Lactation Consultation Note  Patient Name: Margaret Lane [id number] Date: 01/11/2021 Reason for consult: Mother's request;Difficult latch;Term;Nipple pain/trauma Age:28 hours  Infant latched for 20 min both nipples in L and D with some nipple trauma. Mom using comfort gels for pain. Mom set up manual pump to pre pump 5-10 before latching to elongate her nipple.   Plan 1. To feed based on cues 8-12x in 24 hr period. Mom to offer breasts using 20 NS for now due to soreness.  2. Soreness decreases, mom work on latching to the breast.  3. If unable to latch, mom to offer EBM via cup feeder.  4. I and O sheet reviewed.  5. LC brochure of inpatient and outpatient services reviewed.  All questions answered at the end of the visit.   Maternal Data Has patient been taught Hand Expression?: Yes Does the patient have breastfeeding experience prior to this delivery?: Yes How long did the patient breastfeed?: used a nipple shield for 3 months and pumped and bottle fed for  85months  Feeding Mother's Current Feeding Choice: Breast Milk  LATCH Score Latch: Repeated attempts needed to sustain latch, nipple held in mouth throughout feeding, stimulation needed to elicit sucking reflex.  Audible Swallowing: Spontaneous and intermittent  Type of Nipple: Flat (small short shafted nipples)  Comfort (Breast/Nipple): Engorged, cracked, bleeding, large blisters, severe discomfort  Hold (Positioning): Assistance needed to correctly position infant at breast and maintain latch.  LATCH Score: 5   Lactation Tools Discussed/Used Tools: Nipple Shields;Comfort gels;Shells;Pump;Flanges Nipple shield size: 20 Flange Size: 21 Breast pump type: Manual Pump Education: Setup, frequency, and cleaning;Milk Storage Reason for Pumping: elongate nipples Pumping frequency: pre pump before lathching 5-10 min  Interventions Interventions: Breast feeding basics  reviewed;Breast compression;Assisted with latch;Adjust position;Skin to skin;Support pillows;Breast massage;Position options;Hand express;Expressed milk;Education;Pre-pump if needed;Shells;Comfort gels;Hand pump  Discharge Pump: Personal  Consult Status Consult Status: Follow-up Date: 01/12/21 Follow-up type: In-patient    Margaret Fana  Lane 01/11/2021, 8:23 PM

## 2021-01-12 LAB — CBC
HCT: 32.7 % — ABNORMAL LOW (ref 36.0–46.0)
Hemoglobin: 10.8 g/dL — ABNORMAL LOW (ref 12.0–15.0)
MCH: 30.1 pg (ref 26.0–34.0)
MCHC: 33 g/dL (ref 30.0–36.0)
MCV: 91.1 fL (ref 80.0–100.0)
Platelets: 124 10*3/uL — ABNORMAL LOW (ref 150–400)
RBC: 3.59 MIL/uL — ABNORMAL LOW (ref 3.87–5.11)
RDW: 13.6 % (ref 11.5–15.5)
WBC: 10.5 10*3/uL (ref 4.0–10.5)
nRBC: 0 % (ref 0.0–0.2)

## 2021-01-12 NOTE — Lactation Note (Signed)
This note was copied from a baby's chart. Lactation Consultation Note  Patient Name: Margaret Lane CZYSA'Y Date: 01/12/2021 Reason for consult: Follow-up assessment;Difficult latch;Term Age:28 hours   P2 mother whose infant is now 55 hours old.  This is a term baby at 39+3 weeks.  Mother breast fed her first child for 3 months but had difficulty with latching.  She then pumped and bottle fed her EBM.    RN called for assistance with flange size.  When I arrived mother was pumping with #21 flange size.  Her nipples were red and irritated; red ring to the base of both nipples.  Mother is in so much pain she stated that she wants to "take a break" from latching.  Acknowledged her request and offered to assess.  Upon observation, the #21 flange size is too small.  Education completed regarding correct flange size and placed mother in #27 flanges with coconut oil for lubrication.  Mother reported no pain with pumping.  Baby is fussy and I suggested she feed back any EBM she obtains after pumping.  Mother desires to try to latch at the next feeding with a NS.  Encouraged her to call for assistance.  Parents appreciative.  Offered donor breast milk as an option if baby is not satisfied and parents cannot comfort her.  Mother is not comfortable with using donor milk.  She has a DEBP for home use.  Father supportive.  RN updated.   Maternal Data Has patient been taught Hand Expression?: Yes Does the patient have breastfeeding experience prior to this delivery?: Yes How long did the patient breastfeed?: 3 months and then pumped and bottle fed her EBM  Feeding Mother's Current Feeding Choice: Breast Milk  LATCH Score                    Lactation Tools Discussed/Used Tools: Shells;Pump;Flanges;Coconut oil;Comfort gels Flange Size: 27 Breast pump type: Double-Electric Breast Pump;Manual Pump Education: Setup, frequency, and cleaning (Reviewed) Reason for Pumping: Supplementation for  baby; sore nipples Pumping frequency: Every three hours  Interventions Interventions: Education  Discharge Pump: DEBP;Manual;Personal WIC Program: No  Consult Status Consult Status: Follow-up Date: 01/13/21 Follow-up type: In-patient    Nyles Mitton R Jehan Ranganathan 01/12/2021, 3:56 AM

## 2021-01-12 NOTE — Plan of Care (Signed)
Care plan reviewed.

## 2021-01-12 NOTE — Anesthesia Postprocedure Evaluation (Signed)
Anesthesia Post Note  Patient: Blanchie Noe  Procedure(s) Performed: AN AD HOC LABOR EPIDURAL     Patient location during evaluation: Mother Baby Anesthesia Type: Epidural Level of consciousness: awake and alert Pain management: pain level controlled Vital Signs Assessment: post-procedure vital signs reviewed and stable Respiratory status: spontaneous breathing, nonlabored ventilation and respiratory function stable Cardiovascular status: stable Postop Assessment: no headache, no backache, epidural receding, no apparent nausea or vomiting, patient able to bend at knees, adequate PO intake and able to ambulate Anesthetic complications: no   No notable events documented.  Last Vitals:  Vitals:   01/11/21 2338 01/12/21 0317  BP: 115/62 113/69  Pulse: 95 86  Resp: 18 18  Temp: 36.8 C 36.6 C  SpO2: 98% 99%    Last Pain:  Vitals:   01/12/21 0514  TempSrc:   PainSc: 0-No pain   Pain Goal:                   [place] [employer]

## 2021-01-12 NOTE — Progress Notes (Signed)
Post Partum Day 1 Subjective: no complaints, up ad lib, voiding, tolerating PO, and + flatus  Objective: Blood pressure 113/69, pulse 86, temperature 97.9 F (36.6 C), temperature source Oral, resp. rate 18, height 5\' 2"  (1.575 m), weight 68.5 kg, SpO2 99 %, unknown if currently breastfeeding.  Physical Exam:  General: alert, cooperative, and no distress Lochia: appropriate Uterine Fundus: firm Incision: healing well DVT Evaluation: No evidence of DVT seen on physical exam.  Recent Labs    01/11/21 0701  HGB 13.1  HCT 38.8    Assessment/Plan: Plan for discharge tomorrow D/W discharge instruction in case she and baby want discharge this evenning  LOS: 1 day   03/13/21 II 01/12/2021, 7:05 AM

## 2021-01-12 NOTE — Social Work (Signed)
[name] was referred for history of anxiety.  * Referral screened out by Clinical Social Worker because none of the following criteria appear to apply: ~ History of anxiety during this pregnancy, or of post-partum depression following prior delivery. ~ Diagnosis of anxiety and/or depression within last 3 years. PER chart review, it is noted [name] anxiety onset in 2020. [name] assessed by social work in 2021 and [name] acknowledged her anxiety was situational due to stressors including school and commuting. [name] stated, the previous stressors are no longer a concern.  OR * [name]'s symptoms currently being treated with medication and/or therapy.  Please contact the Clinical Social Worker if needs arise, by [name] request, or if [name] scores greater than 9/yes to question 10 on [place] Postpartum Depression Screen.   Margaret Lane, MSW, LCSW [facility]  Clinical Social Worker  [phone] 01/12/2021  8:32 AM  

## 2021-01-13 MED ORDER — ACETAMINOPHEN 325 MG PO TABS: 650.0000 mg | ORAL_TABLET | ORAL | 0 refills | Status: AC | PRN

## 2021-01-13 MED ORDER — IBUPROFEN 600 MG PO TABS: 600.0000 mg | ORAL_TABLET | Freq: Four times a day (QID) | ORAL | 0 refills | Status: AC | PRN

## 2021-01-13 NOTE — Lactation Note (Signed)
This note was copied from a baby's chart. Lactation Consultation Note  Patient Name: Margaret Lane [id number] Date: 01/13/2021 Reason for consult: Follow-up assessment;Term;Nipple pain/trauma Age:28 hours  Mom sitting in bed baby latched to right breast football hold, dad providing breast compression. Mom c/o pain with latching and throughout feeding. Reports baby with smelly gas. Mom concerned of over supply and would like to d/c NS as soon as possible, reports developing an oversupply with first baby as a result. Mom reports using spacer 44mm inside of flange for pumping.  Mom using 16mm nipple shield with this feed, baby with wide flange, audible swallows, d/c'ed latch per mom's request. Bruising and compression stripe noted to nipples and areola bilat, edema noted around left nipple. Baby latched as expected without NS, however d/c'ed latch d/t mom with c/o pain.  Mom fitted for 5mm nipple shield, latched baby to left breast football hold w/o assistance, reports no pain with this latch, baby tolerated well, multiple audible swallows noted. Mom fitted for 64mm flange L, 71mm right.   Advised continued use of EBM to nipples and Comfort Gels until healed. 110mm nipple shield for latching until nipples heal, 35mm flange for pumping. Feed on cue 8-12 times a day, pump after feeds d/t use of NS, mom reports with an [facility] appointment tomorrow. Mom voiced understanding and with no further concerns. Mom to call for assistance with next feeding. Left the room with baby latched to left breast ~50min mark. BGilliam, RN, IBCLC    Feeding Mother's Current Feeding Choice: Breast Milk  LATCH Score Latch: Grasps breast easily, tongue down, lips flanged, rhythmical sucking.  Audible Swallowing: Spontaneous and intermittent  Type of Nipple: Everted at rest and after stimulation  Comfort (Breast/Nipple): Filling, red/small blisters or bruises, mild/mod discomfort  Hold (Positioning): Assistance needed to  correctly position infant at breast and maintain latch.  LATCH Score: 8     Interventions Interventions: Breast feeding basics reviewed;Assisted with latch  Discharge    Consult Status Consult Status: Complete    Margaret Lane 01/13/2021, 3:59 PM

## 2021-01-13 NOTE — Discharge Summary (Signed)
Postpartum Discharge Summary  Date of Service updated 01/13/21     Patient Name: Margaret Lane DOB: 1990/10/29 MRN: [id number]  Date of admission: 01/11/2021 Delivery date:01/11/2021  Delivering provider: Dian Queen  Date of discharge: 01/13/2021  Admitting diagnosis: Pregnancy [Z34.90] NSVD (normal spontaneous vaginal delivery) [O80] Intrauterine pregnancy: [redacted]w[redacted]d    Secondary diagnosis:  Active Problems:   Pregnancy   NSVD (normal spontaneous vaginal delivery)  Additional problems:     Discharge diagnosis: Term Pregnancy Delivered                                              Post partum procedures: Augmentation: Pitocin Complications: None  Hospital course: Induction of Labor With Vaginal Delivery   30y.o. yo G2P2002 at 331w3das admitted to the hospital 01/11/2021 for induction of labor.  Indication for induction: Favorable cervix at term.  Patient had an uncomplicated labor course as follows: Membrane Rupture Time/Date: 8:55 AM ,01/11/2021   Delivery Method:Vaginal, Spontaneous  Episiotomy: None  Lacerations:  None  Details of delivery can be found in separate delivery note.  Patient had a routine postpartum course. Patient is discharged home 01/13/21.  Newborn Data: Birth date:01/11/2021  Birth time:4:32 PM  Gender:Female  Living status:Living  Apgars:9 ,9  Weight:3405 g   Magnesium Sulfate received: No BMZ received: No Rhophylac:No MMR:No T-DaP:Given prenatally Flu: No Transfusion:No  Physical exam  Vitals:   01/12/21 0826 01/12/21 1521 01/12/21 2155 01/13/21 0519  BP: 115/73 (!) 126/59 120/62 (!) 115/59  Pulse: 84 88 86 76  Resp: '18 18 19 19  ' Temp: 98.1 F (36.7 C) 98.3 F (36.8 C) 98.4 F (36.9 C) 98.2 F (36.8 C)  TempSrc: Oral Oral Oral Oral  SpO2: 99% 98% 98% 99%  Weight:      Height:       General: alert, cooperative, and no distress Lochia: appropriate Uterine Fundus: firm Incision: Healing well with no significant drainage DVT  Evaluation: No evidence of DVT seen on physical exam. Labs: Lab Results  Component Value Date   WBC 10.5 01/12/2021   HGB 10.8 (L) 01/12/2021   HCT 32.7 (L) 01/12/2021   MCV 91.1 01/12/2021   PLT 124 (L) 01/12/2021   CMP Latest Ref Rng & Units 12/18/2008  Glucose 70 - 99 mg/dL 86  BUN 6 - 23 mg/dL 9  Creatinine 0.4 - 1.2 mg/dL 0.6  Sodium 135 - 145 mEq/L 140  Potassium 3.5 - 5.1 mEq/L 4.0  Chloride 96 - 112 mEq/L 102  CO2 19 - 32 mEq/L 29  Calcium 8.4 - 10.5 mg/dL 9.2   Edinburgh Score: Edinburgh Postnatal Depression Scale Screening Tool 01/12/2021  I have been able to laugh and see the funny side of things. 0  I have looked forward with enjoyment to things. 0  I have blamed myself unnecessarily when things went wrong. 1  I have been anxious or worried for no good reason. 1  I have felt scared or panicky for no good reason. 0  Things have been getting on top of me. 1  I have been so unhappy that I have had difficulty sleeping. 0  I have felt sad or miserable. 0  I have been so unhappy that I have been crying. 0  The thought of harming myself has occurred to me. 0  Edinburgh Postnatal Depression Scale Total 3  After visit meds:  Allergies as of 01/13/2021       Reactions   Flonase [fluticasone Propionate] Anaphylaxis   "itching in throat and felt like throat was closing making it difficult to breathe."        Medication List     TAKE these medications    acetaminophen 325 MG tablet Commonly known as: Tylenol Take 2 tablets (650 mg total) by mouth every 4 (four) hours as needed (for pain scale < 4).   ibuprofen 600 MG tablet Commonly known as: ADVIL Take 1 tablet (600 mg total) by mouth every 6 (six) hours as needed.   prenatal multivitamin Tabs tablet Take 1 tablet by mouth at bedtime.         Discharge home in stable condition Infant Feeding: Breast Infant Disposition:home with mother Discharge instruction: per After Visit Summary and Postpartum  booklet. Activity: Advance as tolerated. Pelvic rest for 6 weeks.  Diet: routine diet Anticipated Birth Control: Unsure Postpartum Appointment:6 weeks Additional Postpartum F/U:  Future Appointments:No future appointments. Follow up Visit:      01/13/2021 Allena Katz, MD

## 2021-01-14 ENCOUNTER — Ambulatory Visit: Payer: Self-pay

## 2021-01-14 NOTE — Lactation Note (Signed)
This note was copied from a baby's chart. Lactation Consultation Note  Patient Name: Girl Caelie [place] MBTDH'R Date: 01/14/2021 Reason for consult: Follow-up assessment Age:28 hours   LC Follow Up Note:  Mother called for lactation consultant:   Mother had a few questions which I answered to her satisfaction.  She reported that baby is doing "much better" now and the family is ready for discharge.  Baby had an a.m. bilirubin level drawn; results pending.  No further concerns.  Mother has our OP phone number for after discharge.  She also has a lactation follow up visit to her home this week.     Maternal Data    Feeding    LATCH Score                    Lactation Tools Discussed/Used    Interventions    Discharge    Consult Status Consult Status: Complete Date: 01/14/21 Follow-up type: Call as needed    Kavita Bartl R Jaryan Chicoine 01/14/2021, 7:53 AM

## 2021-01-25 ENCOUNTER — Telehealth (HOSPITAL_COMMUNITY): Payer: Self-pay | Admitting: *Deleted

## 2021-01-25 NOTE — Telephone Encounter (Signed)
Patient voiced no questions or concerns regarding her own health. EPDS = 0. Patient voiced no questions or concerns regarding baby at this time. Reported infant sleeps in a bassinet on her back. RN reviewed ABCs of safe sleep - patient verbalized understanding. Patient requested RN email information regarding hospital virtual postpartum classes and support groups. Email sent. Deforest Hoyles, RN, 01/25/21, [phone].

## 2021-03-22 IMAGING — US US MFM OB LIMITED
1 series · 15 of 28 positions shown · non-contrast
Comparison: none

[Series 1: us mfm ob limited · 15 of 45 slices shown]
[im 1/45]
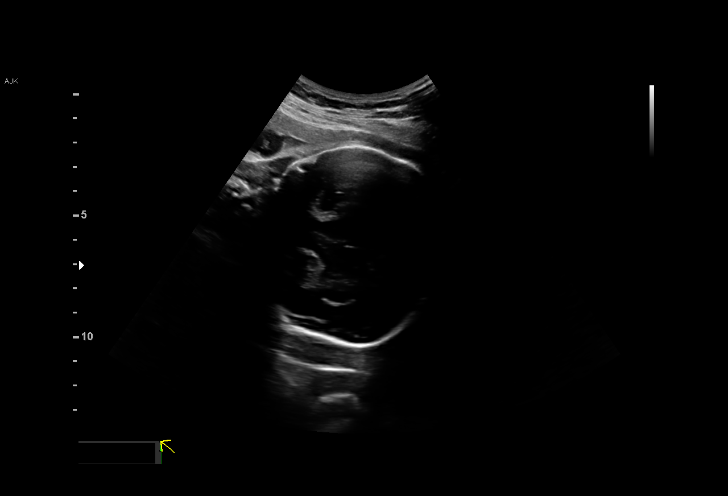
[im 4/45]
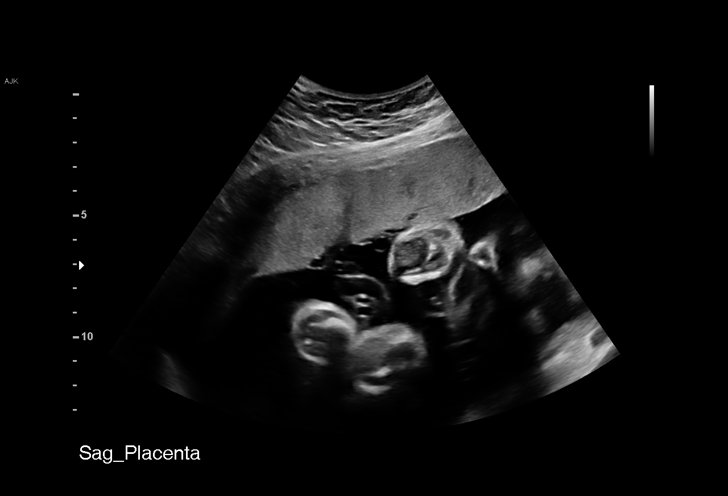
[im 7/45]
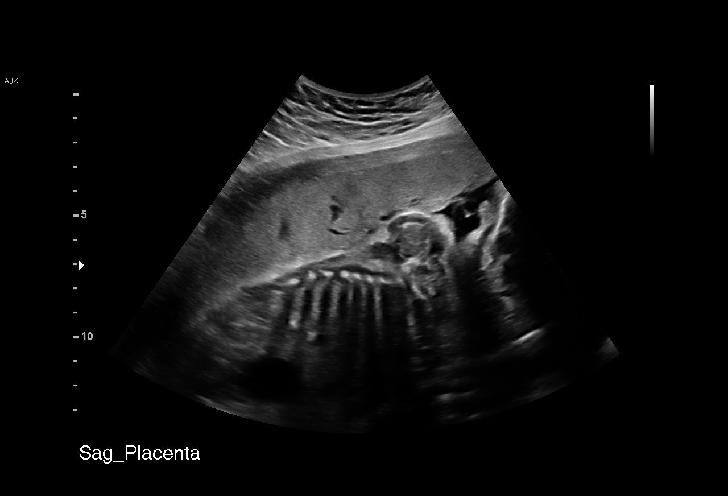
[im 10/45]
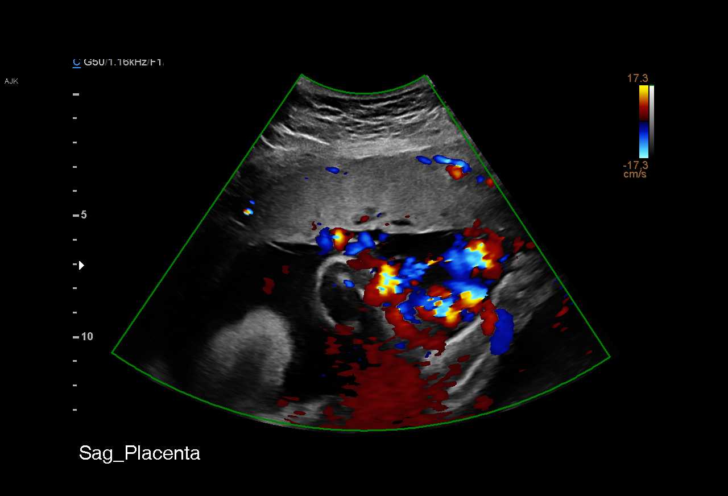
[im 14/45]
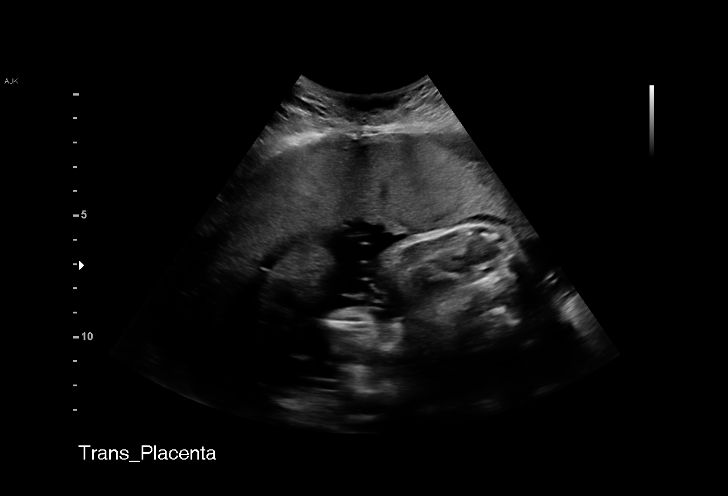
[im 17/45]
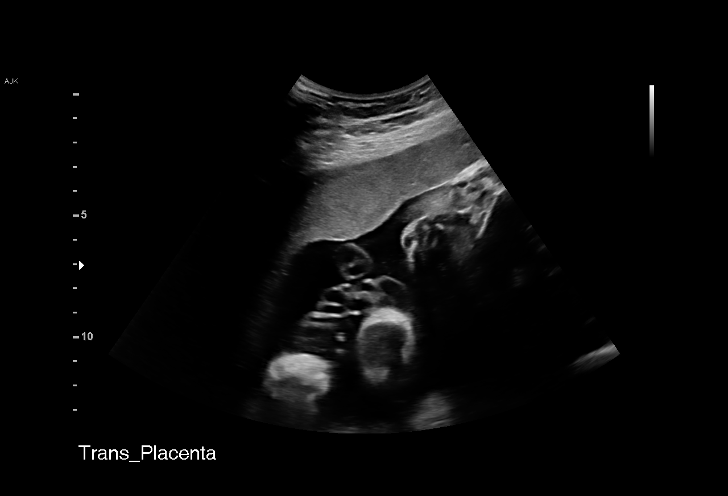
[im 20/45]
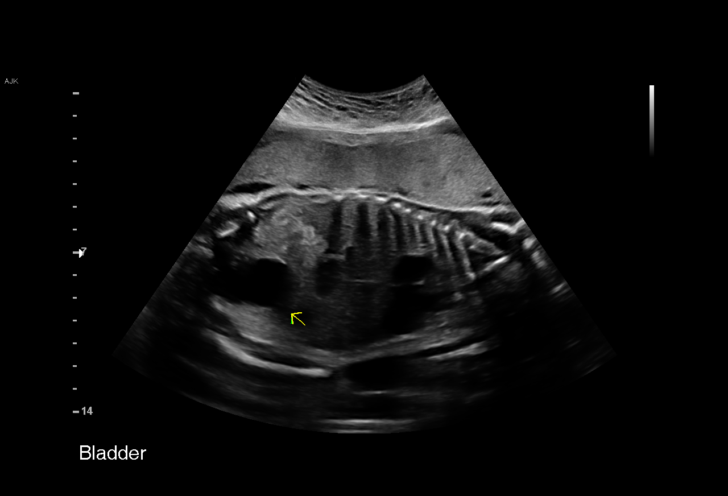
[im 23/45]
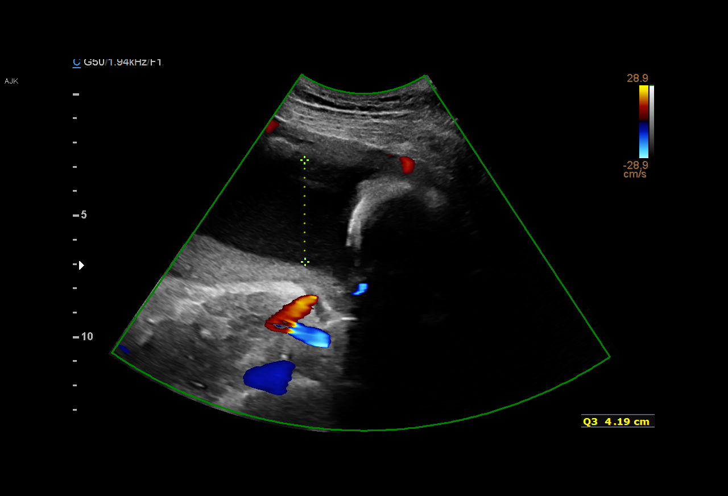
[im 25/45]
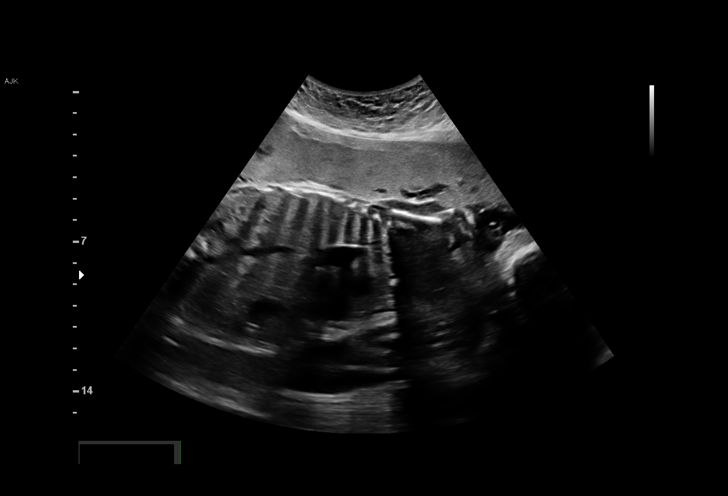
[im 28/45]
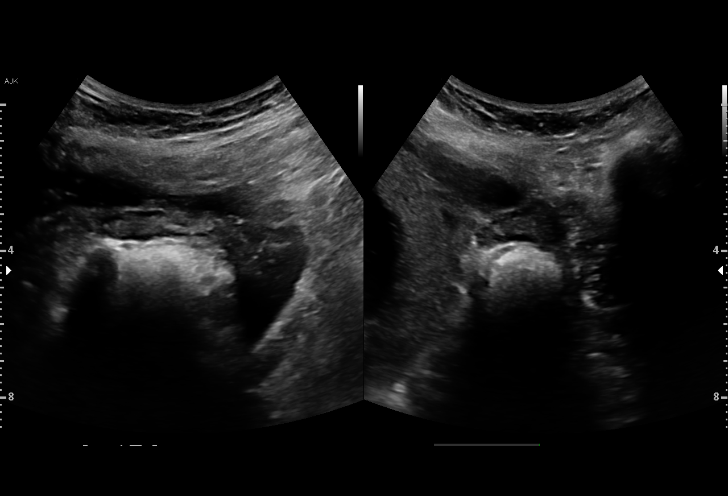
[im 31/45]
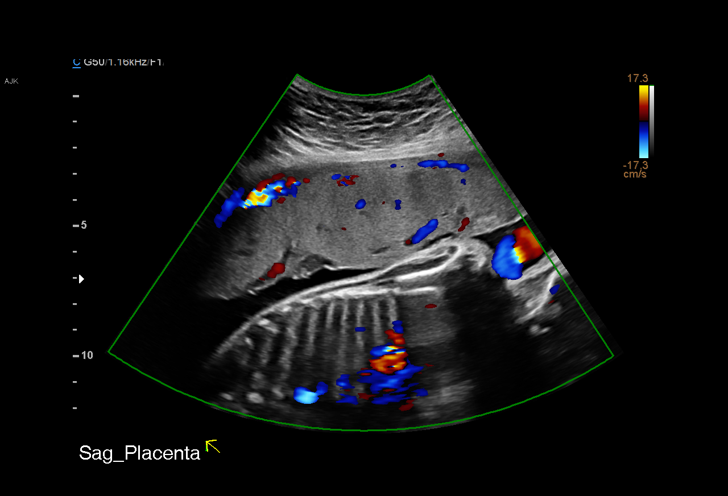
[im 35/45]
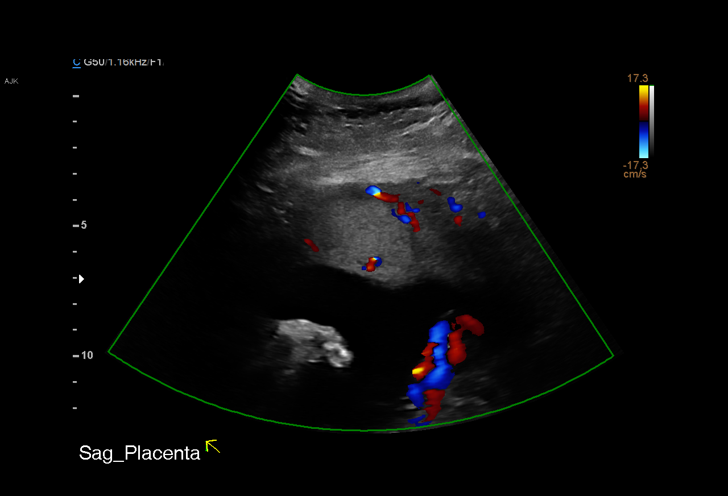
[im 38/45]
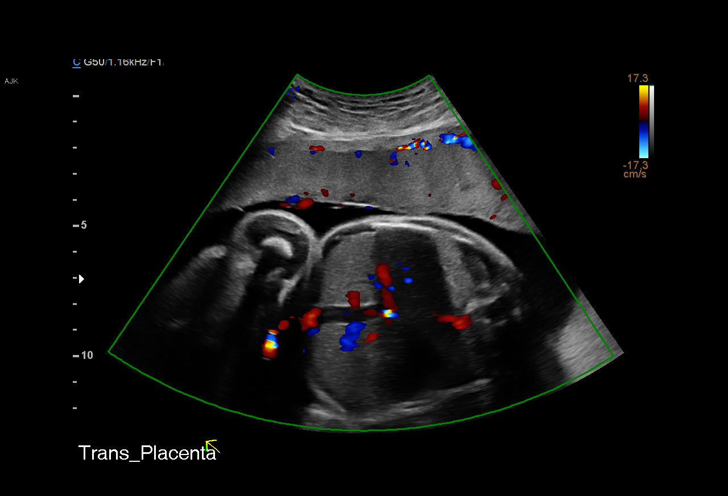
[im 41/45]
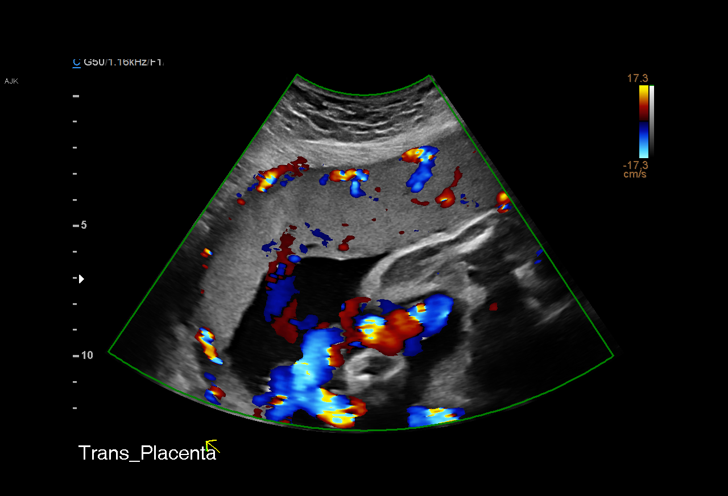
[im 45/45]
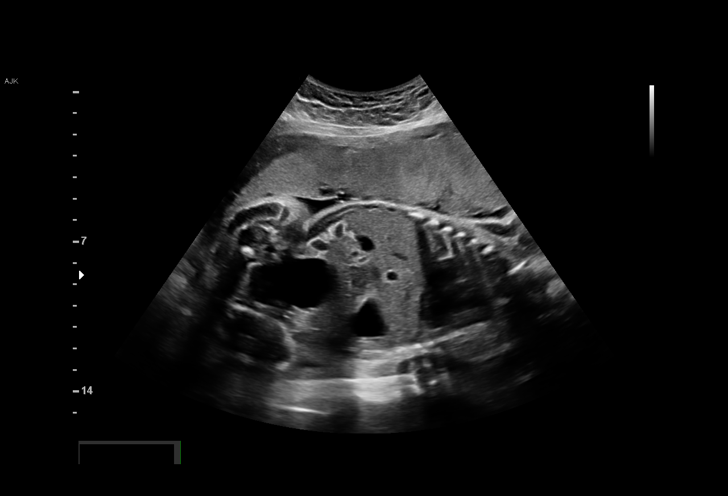

[15 of 28 positions shown; findings below may reference images not displayed]

1  US MFM OB LIMITED                    76815.01     ROUSS ZETA
 ----------------------------------------------------------------------

 ----------------------------------------------------------------------
Indications

  31 weeks gestation of pregnancy
  Vaginal bleeding in pregnancy, third trimester
 ----------------------------------------------------------------------
Fetal Evaluation

 Num Of Fetuses:          1
 Fetal Heart Rate(bpm):   146
 Cardiac Activity:        Observed
 Presentation:            Cephalic
 Placenta:                Anterior
 P. Cord Insertion:       Visualized, central

 Amniotic Fluid
 AFI FV:      Within normal limits

 AFI Sum(cm)     %Tile       Largest Pocket(cm)
 21.68           85

 RUQ(cm)       RLQ(cm)       LUQ(cm)        LLQ(cm)


 Comment:    Stomach, bladder, and diaphragm noted. No placental abruption
             or previa identified.
OB History

 Gravidity:    1
Gestational Age

 Clinical EDD:  31w 0d                                        EDD:   06/19/19
 Best:          31w 0d     Det. By:  Clinical EDD             EDD:   06/19/19
Cervix Uterus Adnexa
 Cervix
 Not visualized (advanced GA >23wks)

 Uterus
 No abnormality visualized.

 Left Ovary
 Within normal limits.

 Right Ovary
 Not visualized.

 Adnexa
 No abnormality visualized.
Impression

 Limited exam
 Vaginal bleeding in the third trimester
 Normal amniotic fluid
 No evidence of placental abruption or previa
Recommendations

 Follow up as clinically indicated.
